# Patient Record
Sex: Female | Born: 1945
Health system: Southern US, Community
[De-identification: ages and names within clinical notes are randomized; demographics above are authoritative.]

## PROBLEM LIST (undated history)

## (undated) DIAGNOSIS — C50919 Malignant neoplasm of unspecified site of unspecified female breast: Secondary | ICD-10-CM

## (undated) DIAGNOSIS — N814 Uterovaginal prolapse, unspecified: Secondary | ICD-10-CM

## (undated) DIAGNOSIS — Z8781 Personal history of (healed) traumatic fracture: Secondary | ICD-10-CM

## (undated) DIAGNOSIS — B009 Herpesviral infection, unspecified: Secondary | ICD-10-CM

## (undated) DIAGNOSIS — T7840XA Allergy, unspecified, initial encounter: Secondary | ICD-10-CM

## (undated) DIAGNOSIS — Z780 Asymptomatic menopausal state: Secondary | ICD-10-CM

## (undated) DIAGNOSIS — M8580 Other specified disorders of bone density and structure, unspecified site: Secondary | ICD-10-CM

## (undated) DIAGNOSIS — I10 Essential (primary) hypertension: Secondary | ICD-10-CM

## (undated) HISTORY — DX: Personal history of (healed) traumatic fracture: Z87.81

## (undated) HISTORY — DX: Uterovaginal prolapse, unspecified: N81.4

## (undated) HISTORY — DX: Essential (primary) hypertension: I10

## (undated) HISTORY — PX: TUBAL LIGATION: SHX77

## (undated) HISTORY — DX: Herpesviral infection, unspecified: B00.9

## (undated) HISTORY — DX: Asymptomatic menopausal state: Z78.0

## (undated) HISTORY — DX: Malignant neoplasm of unspecified site of unspecified female breast: C50.919

## (undated) HISTORY — DX: Allergy, unspecified, initial encounter: T78.40XA

---

## 1996-05-22 DIAGNOSIS — Z780 Asymptomatic menopausal state: Secondary | ICD-10-CM

## 1996-05-22 HISTORY — DX: Asymptomatic menopausal state: Z78.0

## 1998-08-23 ENCOUNTER — Other Ambulatory Visit: Admission: RE | Admit: 1998-08-23 | Discharge: 1998-08-23 | Payer: Self-pay | Admitting: Gynecology

## 1999-09-07 ENCOUNTER — Other Ambulatory Visit: Admission: RE | Admit: 1999-09-07 | Discharge: 1999-09-07 | Payer: Self-pay | Admitting: Obstetrics and Gynecology

## 2000-01-06 ENCOUNTER — Encounter: Payer: Self-pay | Admitting: Family Medicine

## 2000-01-06 ENCOUNTER — Encounter: Admission: RE | Admit: 2000-01-06 | Discharge: 2000-01-06 | Payer: Self-pay | Admitting: Family Medicine

## 2000-09-12 ENCOUNTER — Other Ambulatory Visit: Admission: RE | Admit: 2000-09-12 | Discharge: 2000-09-12 | Payer: Self-pay | Admitting: Obstetrics and Gynecology

## 2001-01-16 ENCOUNTER — Encounter: Admission: RE | Admit: 2001-01-16 | Discharge: 2001-01-16 | Payer: Self-pay | Admitting: Family Medicine

## 2001-01-16 ENCOUNTER — Encounter: Payer: Self-pay | Admitting: Family Medicine

## 2001-02-04 ENCOUNTER — Encounter: Admission: RE | Admit: 2001-02-04 | Discharge: 2001-02-04 | Payer: Self-pay | Admitting: Family Medicine

## 2001-02-04 ENCOUNTER — Encounter: Payer: Self-pay | Admitting: Family Medicine

## 2001-11-19 ENCOUNTER — Other Ambulatory Visit: Admission: RE | Admit: 2001-11-19 | Discharge: 2001-11-19 | Payer: Self-pay | Admitting: *Deleted

## 2002-01-22 ENCOUNTER — Encounter: Payer: Self-pay | Admitting: Family Medicine

## 2002-01-22 ENCOUNTER — Encounter: Admission: RE | Admit: 2002-01-22 | Discharge: 2002-01-22 | Payer: Self-pay | Admitting: Family Medicine

## 2002-12-02 ENCOUNTER — Other Ambulatory Visit: Admission: RE | Admit: 2002-12-02 | Discharge: 2002-12-02 | Payer: Self-pay | Admitting: Obstetrics and Gynecology

## 2003-01-28 ENCOUNTER — Encounter: Payer: Self-pay | Admitting: Family Medicine

## 2003-01-28 ENCOUNTER — Encounter: Admission: RE | Admit: 2003-01-28 | Discharge: 2003-01-28 | Payer: Self-pay | Admitting: Family Medicine

## 2004-01-27 ENCOUNTER — Other Ambulatory Visit: Admission: RE | Admit: 2004-01-27 | Discharge: 2004-01-27 | Payer: Self-pay | Admitting: Obstetrics and Gynecology

## 2004-02-03 ENCOUNTER — Encounter: Admission: RE | Admit: 2004-02-03 | Discharge: 2004-02-03 | Payer: Self-pay | Admitting: Family Medicine

## 2004-02-10 ENCOUNTER — Encounter: Admission: RE | Admit: 2004-02-10 | Discharge: 2004-02-10 | Payer: Self-pay | Admitting: Family Medicine

## 2005-02-23 ENCOUNTER — Encounter: Admission: RE | Admit: 2005-02-23 | Discharge: 2005-02-23 | Payer: Self-pay | Admitting: Obstetrics and Gynecology

## 2005-06-21 ENCOUNTER — Other Ambulatory Visit: Admission: RE | Admit: 2005-06-21 | Discharge: 2005-06-21 | Payer: Self-pay | Admitting: Obstetrics and Gynecology

## 2006-02-26 ENCOUNTER — Encounter: Admission: RE | Admit: 2006-02-26 | Discharge: 2006-02-26 | Payer: Self-pay | Admitting: Obstetrics and Gynecology

## 2006-09-05 ENCOUNTER — Other Ambulatory Visit: Admission: RE | Admit: 2006-09-05 | Discharge: 2006-09-05 | Payer: Self-pay | Admitting: Obstetrics and Gynecology

## 2006-11-29 ENCOUNTER — Ambulatory Visit: Payer: Self-pay | Admitting: Cardiology

## 2006-12-18 ENCOUNTER — Ambulatory Visit: Payer: Self-pay

## 2006-12-18 ENCOUNTER — Ambulatory Visit: Payer: Self-pay | Admitting: Cardiology

## 2006-12-18 LAB — CONVERTED CEMR LAB
ALT: 19 units/L (ref 0–35)
Bilirubin, Direct: 0.1 mg/dL (ref 0.0–0.3)
Cholesterol: 215 mg/dL (ref 0–200)
Direct LDL: 146.5 mg/dL
HDL: 41.9 mg/dL (ref >39.0)
Total CHOL/HDL Ratio: 5.1
Total Protein: 6.8 g/dL (ref 6.0–8.3)
Triglycerides: 61 mg/dL (ref 0–149)

## 2007-01-10 ENCOUNTER — Ambulatory Visit: Payer: Self-pay | Admitting: Cardiology

## 2007-02-28 ENCOUNTER — Encounter: Admission: RE | Admit: 2007-02-28 | Discharge: 2007-02-28 | Payer: Self-pay | Admitting: Obstetrics and Gynecology

## 2007-03-06 ENCOUNTER — Encounter: Admission: RE | Admit: 2007-03-06 | Discharge: 2007-03-06 | Payer: Self-pay | Admitting: Obstetrics and Gynecology

## 2007-11-01 ENCOUNTER — Ambulatory Visit: Payer: Self-pay | Admitting: Internal Medicine

## 2007-11-20 ENCOUNTER — Ambulatory Visit: Payer: Self-pay | Admitting: Internal Medicine

## 2007-11-27 ENCOUNTER — Other Ambulatory Visit: Admission: RE | Admit: 2007-11-27 | Discharge: 2007-11-27 | Payer: Self-pay | Admitting: Obstetrics and Gynecology

## 2008-03-26 ENCOUNTER — Encounter: Admission: RE | Admit: 2008-03-26 | Discharge: 2008-03-26 | Payer: Self-pay | Admitting: Obstetrics and Gynecology

## 2008-04-13 ENCOUNTER — Encounter (INDEPENDENT_AMBULATORY_CARE_PROVIDER_SITE_OTHER): Payer: Self-pay | Admitting: Diagnostic Radiology

## 2008-04-13 ENCOUNTER — Encounter: Admission: RE | Admit: 2008-04-13 | Discharge: 2008-04-13 | Payer: Self-pay | Admitting: Obstetrics and Gynecology

## 2008-04-20 ENCOUNTER — Encounter: Admission: RE | Admit: 2008-04-20 | Discharge: 2008-04-20 | Payer: Self-pay | Admitting: Obstetrics and Gynecology

## 2008-04-28 ENCOUNTER — Ambulatory Visit: Admission: RE | Admit: 2008-04-28 | Discharge: 2008-05-19 | Payer: Self-pay | Admitting: Radiation Oncology

## 2008-05-22 HISTORY — PX: MASTECTOMY: SHX3

## 2008-06-11 ENCOUNTER — Ambulatory Visit (HOSPITAL_COMMUNITY): Admission: RE | Admit: 2008-06-11 | Discharge: 2008-06-12 | Payer: Self-pay | Admitting: Surgery

## 2008-06-11 ENCOUNTER — Encounter (INDEPENDENT_AMBULATORY_CARE_PROVIDER_SITE_OTHER): Payer: Self-pay | Admitting: Surgery

## 2008-06-30 ENCOUNTER — Ambulatory Visit: Payer: Self-pay | Admitting: Oncology

## 2008-07-08 LAB — CBC WITH DIFFERENTIAL/PLATELET
Basophils Absolute: 0 10*3/uL (ref 0.0–0.1)
Eosinophils Absolute: 0.3 10*3/uL (ref 0.0–0.5)
HCT: 42.6 % (ref 34.8–46.6)
HGB: 14.5 g/dL (ref 11.6–15.9)
MONO#: 0.7 10*3/uL (ref 0.1–0.9)
NEUT%: 70.2 % (ref 39.6–76.8)
WBC: 7.3 10*3/uL (ref 3.9–10.0)
lymph#: 1.2 10*3/uL (ref 0.9–3.3)

## 2008-07-08 LAB — COMPREHENSIVE METABOLIC PANEL
ALT: 16 U/L (ref 0–35)
BUN: 14 mg/dL (ref 6–23)
CO2: 29 mEq/L (ref 19–32)
Calcium: 9.9 mg/dL (ref 8.4–10.5)
Chloride: 101 mEq/L (ref 96–112)
Creatinine, Ser: 0.73 mg/dL (ref 0.40–1.20)
Total Bilirubin: 0.4 mg/dL (ref 0.3–1.2)

## 2008-09-19 HISTORY — PX: AUGMENTATION MAMMAPLASTY: SUR837

## 2009-03-29 ENCOUNTER — Encounter: Admission: RE | Admit: 2009-03-29 | Discharge: 2009-03-29 | Payer: Self-pay | Admitting: Obstetrics and Gynecology

## 2010-03-30 ENCOUNTER — Encounter: Admission: RE | Admit: 2010-03-30 | Discharge: 2010-03-30 | Payer: Self-pay | Admitting: Obstetrics and Gynecology

## 2010-04-21 ENCOUNTER — Emergency Department (HOSPITAL_COMMUNITY)
Admission: EM | Admit: 2010-04-21 | Discharge: 2010-04-21 | Payer: Self-pay | Source: Home / Self Care | Admitting: Emergency Medicine

## 2010-06-09 ENCOUNTER — Encounter
Admission: RE | Admit: 2010-06-09 | Discharge: 2010-06-21 | Payer: Self-pay | Source: Home / Self Care | Attending: Family Medicine | Admitting: Family Medicine

## 2010-06-11 ENCOUNTER — Encounter: Payer: Self-pay | Admitting: Obstetrics and Gynecology

## 2010-06-12 ENCOUNTER — Encounter: Payer: Self-pay | Admitting: Obstetrics and Gynecology

## 2010-06-15 ENCOUNTER — Encounter: Admit: 2010-06-15 | Payer: Self-pay | Admitting: Family Medicine

## 2010-06-22 ENCOUNTER — Ambulatory Visit: Payer: No Typology Code available for payment source | Attending: Family Medicine | Admitting: Physical Therapy

## 2010-06-22 DIAGNOSIS — M542 Cervicalgia: Secondary | ICD-10-CM | POA: Insufficient documentation

## 2010-06-22 DIAGNOSIS — M2569 Stiffness of other specified joint, not elsewhere classified: Secondary | ICD-10-CM | POA: Insufficient documentation

## 2010-06-22 DIAGNOSIS — IMO0001 Reserved for inherently not codable concepts without codable children: Secondary | ICD-10-CM | POA: Insufficient documentation

## 2010-06-24 ENCOUNTER — Ambulatory Visit: Payer: No Typology Code available for payment source | Admitting: Physical Therapy

## 2010-06-24 ENCOUNTER — Encounter: Payer: Self-pay | Admitting: Physical Therapy

## 2010-06-27 ENCOUNTER — Ambulatory Visit: Payer: No Typology Code available for payment source | Admitting: Physical Therapy

## 2010-06-29 ENCOUNTER — Ambulatory Visit: Payer: No Typology Code available for payment source | Admitting: Physical Therapy

## 2010-07-04 ENCOUNTER — Ambulatory Visit: Payer: No Typology Code available for payment source | Admitting: Physical Therapy

## 2010-07-06 ENCOUNTER — Ambulatory Visit: Payer: No Typology Code available for payment source | Admitting: Physical Therapy

## 2010-07-11 ENCOUNTER — Ambulatory Visit: Payer: No Typology Code available for payment source

## 2010-07-13 ENCOUNTER — Ambulatory Visit: Payer: No Typology Code available for payment source | Admitting: Physical Therapy

## 2010-07-18 ENCOUNTER — Encounter: Payer: No Typology Code available for payment source | Admitting: Physical Therapy

## 2010-07-20 ENCOUNTER — Ambulatory Visit: Payer: No Typology Code available for payment source | Admitting: Physical Therapy

## 2010-09-05 LAB — DIFFERENTIAL
Basophils Absolute: 0 10*3/uL (ref 0.0–0.1)
Lymphocytes Relative: 24 % (ref 12–46)
Monocytes Absolute: 0.4 10*3/uL (ref 0.1–1.0)
Neutro Abs: 4.7 10*3/uL (ref 1.7–7.7)

## 2010-09-05 LAB — CBC
Hemoglobin: 14.8 g/dL (ref 12.0–15.0)
Platelets: 257 10*3/uL (ref 150–400)
RDW: 13.2 % (ref 11.5–15.5)
WBC: 6.6 10*3/uL (ref 4.0–10.5)

## 2010-09-05 LAB — BASIC METABOLIC PANEL
Calcium: 10 mg/dL (ref 8.4–10.5)
GFR calc non Af Amer: >60 mL/min (ref >60)
Glucose, Bld: 91 mg/dL (ref 70–99)
Sodium: 140 mEq/L (ref 135–145)

## 2010-10-04 NOTE — Op Note (Signed)
NAMESHERETA, CROTHERS               ACCOUNT NO.:  [id number]   MEDICAL RECORD NO.:  [id number]          PATIENT TYPE:  OIB   LOCATION:  5155                         FACILITY:  [facility]   PHYSICIAN:  Etter Sjogren, M.D.     DATE OF BIRTH:  04/24/1946   DATE OF PROCEDURE:  06/11/2008  DATE OF DISCHARGE:                               OPERATIVE REPORT   PREOPERATIVE DIAGNOSIS:  Right breast cancer.   POSTOPERATIVE DIAGNOSIS:  Right breast cancer.   PROCEDURES PERFORMED:  1. Right breast reconstruction of the tissue expander.  2. Placement of acellular dermal matrix for inframammary      reconstruction.   SURGEON:  Etter Sjogren, MD   ANESTHESIA:  General.   ESTIMATED BLOOD LOSS:  Minimal.   CLINICAL NOTE:  This 65 year old woman has breast cancer, desires  reconstruction.  The options were discussed with her and she preferred  to use a tissue expander, followed as a planned staged procedure by  placing the implant.  This procedure was discussed with her in detail  including the probable use of acellular dermal matrix and she was in  agreement with this plan.  She will be photographed and she understood  the risks included, but were not limited to bleeding, infection,  anesthesia complications, healing problems, scarring, fluid collections,  loss of tissue including failure of skin flaps, failure of the acellular  dermal matrix to take loss of symmetry including eventual asymmetry,  displacement of the tissue expander, failure of the device, and eventual  capsular contracture as well as overall disappointment.  She understood  all this and wished to proceed.   DESCRIPTION OF PROCEDURE:  The patient was in the operating room and the  mastectomy completed without complications.  Sentinel lymph nodes were  reported as negative from the report from the pathologist.  The  dissection was carried out at the inferior aspect of pectoralis muscle  elevating it and developing a submuscular  pocket and then a little bit  of serratus anterior was elevated out laterally.  The area was irrigated  thoroughly with saline and meticulous hemostasis was achieved with  electrocautery.  The acellular dermal matrix was then positioned.  It  was cut to fit the defect in the inframammary pole of the breast  reconstruction and was secured with the 4-0 PDS suture from the inferior  border of the pectoralis major muscle to the acellular dermal matrix.  After thoroughly cleaning the gloves, the tissue expander was prepared,  this was an animated tissue expander from [employer], the lot  number was [id number], it was 400 mL, style tissue expander.  The  expander was soaked in antibiotic solution and the submuscular pocket  was bathed in antibiotic solution and all of this was allowed to dwell  there for 5 minutes or longer.  The tissue expander having been  positioned in place at the inferior aspect of the inframammary crease,  the lower limb of the PDS was then secured with 4-0 PDS running suture.  In addition, a few interrupted 4-0 PDS sutures were placed immediately.  The lateral  aspect of this portion of reconstruction was left open after  allowing fluid to drain to the outside to the drains.  Blake drains were  positioned, 19 [place] brought through separate stab wounds  inferolaterally and secured with 3-0 Prolene sutures.  Again, irrigation  and antibiotic solution was placed and the closure with 3-0 Monocryl  interrupted deep dermal sutures, and running 4-0 Monocryl  subcuticular suture.  The mastectomy flaps had excellent color and  appeared to be viable.  The Steri-Strips were applied to the wound, and  a dry sterile dressing, circumferential Ace wraps were applied, and she  was transferred to the recovery room stable having tolerated the  procedure well.      Etter Sjogren, M.D.  Electronically Signed     DB/MEDQ  D:  06/11/2008  T:  06/12/2008  Job:  [id number]

## 2010-10-04 NOTE — Op Note (Signed)
Kendra Velazquez, Kendra Velazquez               ACCOUNT NO.:  [id number]   MEDICAL RECORD NO.:  [id number]          PATIENT TYPE:  OIB   LOCATION:  5155                         FACILITY:  [facility]   PHYSICIAN:  Clovis Pu. 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   POSTOPERATIVE DIAGNOSIS:  Right breast multifocal ductal carcinoma in  situ.   PROCEDURES:  1. Right simple mastectomy.  2. Right sentinel lymph node mapping with injection of methylene blue      dye.   SURGEON:  Maisie Fus A. Cornett, MD   ANESTHESIA:  General endotracheal anesthesia.   ESTIMATED BLOOD LOSS:  Approximately 20 mL.   SPECIMEN:  1. Two right axillary sentinel lymph nodes negative by touch prep.  2. Right breast to pathology.   INDICATIONS FOR PROCEDURE:  The patient is a 65 year old female who was  found to have multifocal right breast DCIS.  We talked about options and  she wished to undergo a right mastectomy with immediate reconstruction  by Dr. Etter Sjogren who is here today.  Risk of bleeding, infection, the  need for more surgery, arm stiffness, numbness, injury to neurovascular  structures were discussed.   DESCRIPTION OF PROCEDURE:  The patient was brought to the operating room  after she was injected in the holding area with 0.25 technetium sulfur  colloid into the right periareolar region of her right breast.  She was  then brought to the operating room.  After induction of general  anesthesia, the right breast was marked preoperatively.  It was then  prepped and draped in a sterile fashion.  Superior and inferior skin  flaps were made using curvilinear incisions above and below the nipple-  areolar complex.  Dissection was carried down just to the skin.  Superior flap was taken up to the clavicle.  Inferior flap was taken  down to  the inferior mammary fold.  The midline was also marked and it  was taken just to the midline once we got to the axilla.  Prior to this,  4 mL of methylene blue dye were injected in a subareolar position and  besides prior to incision.  We used the NeoProbe.  We identified 2  sentinel lymph nodes, one was blue and hot, second was just hot.  These  were negative by touch prep.  There were no other hot nodes in the  axilla.  The breast was then excised off the chest wall in a medial-to-  lateral fashion using the pectoralis major flap until I encountered the  lateral border of the pectoralis minor muscle.  At this point, the  breast with the tail of Mliss Sax was excised.  Specimen was sent to  pathology for evaluation.  Sentinel nodes were negative  by touch prep.  Irrigation was used to suction out.  Hemostasis was  found to be excellent.  At this portion of case, Dr. Etter Sjogren took  over for the reconstruction portion  of this case and closure of the  wound.  Please see his operative note for details of this.  At this  point, the patient remained stable.      [name], M.D.  Electronically Signed     TAC/MEDQ  D:  06/11/2008  T:  06/12/2008  Job:  [id number]

## 2010-10-04 NOTE — Assessment & Plan Note (Signed)
[facility]                            CARDIOLOGY OFFICE NOTE   NAME:Kendra Velazquez, Kendra Velazquez                      MRN:          [id number]  DATE:01/10/2007                            DOB:          01-26-1946    Kendra Velazquez is doing well, and Kendra Velazquez is back for followup.  Kendra Velazquez has  brought a diary of Kendra Velazquez blood pressures.  Kendra Velazquez systolic pressure ranges in  Kendra range of 120-135, and Kendra Velazquez diastolic in Kendra range of 70-80.  Kendra Velazquez does  not have marked swings in Kendra Velazquez blood pressure documented at this time.  It appears that Kendra Velazquez current blood pressure medications are adequate.  Because of a question of a carotid bruit, Kendra Velazquez had carotid Dopplers done.  There is mild intimal thickening with no hemodynamically significant  abnormality; therefore, Kendra impression is that Kendra Velazquez would have 0-39%  range bilateral stenoses, and I explained to Kendra Velazquez that Kendra Velazquez has no  significant disease at this time.  Kendra Velazquez did have a follow-up  cholesterol after Kendra Velazquez trip to [place].  Kendra Velazquez total was 215, increased from  186.  Triglycerides were 61.  HDL was 42, decreased from 52.  Kendra Velazquez LDL  cholesterol was 146, increased from 126.  Kendra prior labs were in  December, 2007.  We discussed these labs.  See Kendra discussion below.  Kendra Velazquez is not having any significant chest pain or shortness of breath.   PAST MEDICAL HISTORY:  Other medical problems, see Kendra list below.   ALLERGIES:  TETRACYCLINE.   MEDICATIONS:  Altace 10, vitamin D, multivitamin, Citracal plus D.   REVIEW OF SYSTEMS:  Kendra Velazquez is feeling well and has no significant problems  at this time.   PHYSICAL EXAMINATION:  Blood pressure today is 130/88 with a pulse of  68.  Velazquez is oriented to person, place, and time.  Kendra Velazquez affect is normal.  HEENT:  No xanthelasma.  Kendra Velazquez has normal extraocular motion.  NECK:  I do not hear a carotid bruit today.  There is no jugular venous  distention.  LUNGS:  Clear.  Respiratory effort is not labored.  CARDIAC:  An  S1 with an S2.  There are no significant murmurs today.  ABDOMEN:  Soft.  Kendra Velazquez has normal bowel sounds.  EXTREMITIES:  There is no peripheral edema.   Labs done are outlined above, including Kendra carotid Doppler and Kendra  cholesterol levels.   Kendra Velazquez's Framingham Risk Score leads to an 8% 10-year risk.  Kendra Velazquez  has risk factors, including hypertension, that is treated, family  history, and age.  Kendra Velazquez LDL on this measurement is a risk factor, so Kendra Velazquez  has 3-4 risk factors.   PROBLEMS:  1. Elevated LDL.  I discussed with Kendra Velazquez Kendra fact that Kendra formal      recommendation would be for Kendra Velazquez LDL to be below 130.  I have      encouraged Kendra Velazquez to work harder on Kendra Velazquez diet to get it into this      range.  Kendra Velazquez does not meet strict criteria for having had an LDL  below 100, although this would be optimal.  I believe Kendra Velazquez would      need drug treatment for this, and Kendra Velazquez is not in favor of this at      this time.  These issues will be followed.  We gave Kendra Velazquez a      recommendation for a dietician, and Kendra Velazquez will have Kendra Velazquez cholesterol      checked in 2-3 months.  I will see Kendra Velazquez back for followup in one      year.  2. Question of a systolic murmur.  I do not hear a murmur today.  Kendra Velazquez      does not need a 2D echo.  3. Strong family history of coronary disease.  4. Ecchymosis from low dose aspirin over time.  I have considered      whether there is a formal recommendation for Kendra Velazquez to take aspirin at      this time, and this would not be recommended until Kendra Velazquez is over 65.      Because Kendra Velazquez has ecchymosis, I have not pushed aspirin.  5. Question of a very soft left carotid bruit.  Kendra Velazquez Doppler shows no      significant disease.  6. Variation in Kendra Velazquez blood pressure historically.  Kendra Velazquez blood pressure      is nicely controlled by Kendra Velazquez measurements.  Kendra Velazquez should stay on Kendra Velazquez      current medications.   Kendra Velazquez is stable and will go about full activities, and I will see  Kendra Velazquez back in one year for followup.      Luis Abed, MD, [facility]  Electronically Signed    JDK/MedQ  DD: 01/10/2007  DT: 01/11/2007  Job #: [id number]   cc:   Quita Skye. Artis Flock, M.D.

## 2010-10-04 NOTE — Assessment & Plan Note (Signed)
[facility]                            CARDIOLOGY OFFICE NOTE   NAME:Kendra Velazquez, Kendra Velazquez                      MRN:          [id number]  DATE:11/29/2006                            DOB:          02-Jan-1946    CARDIOLOGY CONSULTATION.   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  She has been quite stable  medically.  She does have a strong family history of coronary disease in  that her father had a fatal MI at age 25.  He was not a smoker.  She has  not had any chest pain.  She has had no significant shortness of breath.  Over time she has had some variation in her blood pressure.  In general,  it is nicely controlled.  On some occasions at her gynecologist's, the  pressure has been as high as 160/95.  Gynecology has been quite  concerned and when she has seen Dr. Artis Flock in followup her blood pressure  has been adequately controlled.  She is on Altace 10 mg daily.  She does  have a blood pressure cuff and she brings me pressures, showing that in  general her pressure is in the range of 120-130 systolic and 70-80  diastolic.  She sometimes has systolics as high as the 150's.  The  pressures obtained in the gynecology office seem higher than her usual.  She had her blood pressure cuff calibrated through Dr. Blair Heys office  and it calibrated quite well.  Her pressure today in the office is  139/88.   There was also question of a murmur.  This has not been marked finding  through Dr. Blair Heys office.  Today on my exam I do not hear a marked  murmur.  She may have an intermittent flow murmur.  Patient tells me  that she started to take low-dose aspirin and developed ecchymosis and  she has stopped this.  I do have labs from December of 2007 showing that  her HDL was 52 and her triglycerides 38, but her LDL was 126.   PAST MEDICAL HISTORY:  ALLERGIES:  TETRACYCLINE.   Medications:  1.  Altace 10 mg daily.  2. Vitamin D weekly.  3. Multivitamin.  4. Citracal __________.   Other Medical Problems:  See the complete list below.   SOCIAL HISTORY:  1. The patient does not smoke.  2. 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  3. The patient does exercise daily and is quite active and has no      significant symptoms.   FAMILY HISTORY:  The patient's father had a sudden heart attack at age  40 that was fatal.   REVIEW OF SYSTEMS:  She has some MILD SEASONAL ALLERGIES.  Otherwise,  her review of systems is negative.   PHYSICAL EXAM:  Patient's weight is 117 pounds.  Blood pressure is  135/88 with a pulse of 66.  The patient is oriented to person, time and  place.  Affect is normal.  HEENT:  No xanthelasma, she has  normal extraocular motion.  There is  question of a very soft left carotid bruit.  LUNGS:  Clear.  Respiratory effort is not labored.  CARDIAC EXAM:  An S1 with an S2.  There are no clicks or significant  murmurs.  ABDOMEN:  Soft.  She has no masses or bruits.  There are normal bowel  sounds.  She has 2+ distal pulses and no peripheral edema and there are  no musculoskeletal deformities.   EKG is normal.   PROBLEMS:  1. An LDL of 126.  She needs followup fasting lipid profile after she      returns from her trip to [place].  2. Question of a systolic murmur.  I do not hear a murmur today.  At      some point we may proceed with a 2D echocardiogram but not at this      point.  3. Strong family history of coronary artery disease.  4. Ecchymosis from low dose aspirin.  At this point she will not take      aspirin, but we may give her 1 every 3 days in the future.  5. Question of a very soft left carotid bruit.  This is a borderline      call, but with her family history we will proceed with carotid      Dopplers.  6. Variation in her blood pressure as described above.  It seems very      unlikely that she has anything other than  idiopathic hypertension      or essential hypertension.  She is on Altace.  Her blood pressures      are adequately controlled other than in unique situations.  We will      not change her medicines at this time.  I will see her back and we      will consider in the future whether other medications can be added.      It may be that low dose of a beta-blocker may help her.   I will see her for followup.  Records sent from Dr. Blair Heys office are  very helpful.  Dr. Artis Flock has done an excellent job of assessing her over  time.     Luis Abed, MD, [facility]  Electronically Signed    JDK/MedQ  DD: 11/29/2006  DT: 11/30/2006  Job #: [id number]   cc:   Quita Skye. Artis Flock, M.D.

## 2011-03-10 ENCOUNTER — Other Ambulatory Visit: Payer: Self-pay | Admitting: Obstetrics and Gynecology

## 2011-03-10 DIAGNOSIS — Z9011 Acquired absence of right breast and nipple: Secondary | ICD-10-CM

## 2011-03-10 DIAGNOSIS — Z1231 Encounter for screening mammogram for malignant neoplasm of breast: Secondary | ICD-10-CM

## 2011-04-04 ENCOUNTER — Ambulatory Visit
Admission: RE | Admit: 2011-04-04 | Discharge: 2011-04-04 | Disposition: A | Discharge status: Home / Self Care | Payer: No Typology Code available for payment source | Source: Ambulatory Visit | Attending: Obstetrics and Gynecology | Admitting: Obstetrics and Gynecology

## 2011-04-04 DIAGNOSIS — Z1231 Encounter for screening mammogram for malignant neoplasm of breast: Secondary | ICD-10-CM

## 2011-04-04 DIAGNOSIS — Z9011 Acquired absence of right breast and nipple: Secondary | ICD-10-CM

## 2012-03-22 ENCOUNTER — Other Ambulatory Visit: Payer: Self-pay | Admitting: Obstetrics & Gynecology

## 2012-03-22 DIAGNOSIS — M899 Disorder of bone, unspecified: Secondary | ICD-10-CM

## 2012-03-25 ENCOUNTER — Other Ambulatory Visit: Payer: Self-pay | Admitting: Obstetrics & Gynecology

## 2012-03-25 DIAGNOSIS — Z1231 Encounter for screening mammogram for malignant neoplasm of breast: Secondary | ICD-10-CM

## 2012-03-25 DIAGNOSIS — Z9011 Acquired absence of right breast and nipple: Secondary | ICD-10-CM

## 2012-04-30 ENCOUNTER — Ambulatory Visit
Admission: RE | Admit: 2012-04-30 | Discharge: 2012-04-30 | Disposition: A | Discharge status: Home / Self Care | Payer: BC Managed Care – PPO | Source: Ambulatory Visit | Attending: Obstetrics & Gynecology | Admitting: Obstetrics & Gynecology

## 2012-04-30 DIAGNOSIS — Z9011 Acquired absence of right breast and nipple: Secondary | ICD-10-CM

## 2012-04-30 DIAGNOSIS — Z1231 Encounter for screening mammogram for malignant neoplasm of breast: Secondary | ICD-10-CM

## 2012-04-30 DIAGNOSIS — M899 Disorder of bone, unspecified: Secondary | ICD-10-CM

## 2012-08-19 ENCOUNTER — Telehealth: Payer: Self-pay | Admitting: Obstetrics and Gynecology

## 2012-08-19 NOTE — Telephone Encounter (Signed)
Pt needs an order to get new bras from [facility]. Fax number:  [phone]

## 2012-08-20 ENCOUNTER — Telehealth: Payer: Self-pay | Admitting: Orthopedic Surgery

## 2012-08-20 NOTE — Telephone Encounter (Signed)
Spoke with pt who says Medicare will no longer accept the store order form like they used to. Reports Dr Tresa Res can write order for "bras and prosthetic supplies" on a prescription pad and fax to [facility]. Fax # [phone]. Insurance will pay for supplies twice a year at 80%.  aa

## 2012-08-20 NOTE — Telephone Encounter (Signed)
I'll bring you the signed rx for you to fax.   Thanks.

## 2012-08-20 NOTE — Telephone Encounter (Signed)
Spoke with pt who had a mastectomy 4 years ago. Her surgeon used to write her an order to get special bras for post-surgical pts. She is no longer seeing surgeon and is wondering if Dr Tresa Res would be able to write the order for the bras now.  aa

## 2012-08-20 NOTE — Telephone Encounter (Signed)
Spoke to pt to relay msg that Dr Tresa Res would be glad to sign order for bras. Pt to have order form faxed to [place]. Fax # given.  aa

## 2012-08-20 NOTE — Telephone Encounter (Signed)
Sure, that would be fine.  Usually the store has a pre-printed order form that is acceptable for insurance companies.  I'll be glad to sign.

## 2012-08-20 NOTE — Telephone Encounter (Signed)
LM that her order request will be forwarded to [facility] and someone would be in touch when order is faxed.  aa

## 2012-08-22 ENCOUNTER — Telehealth: Payer: Self-pay | Admitting: Obstetrics and Gynecology

## 2012-08-22 NOTE — Telephone Encounter (Signed)
Misty Stanley from [facility] re:rx date written 08/20/12 but pt seen  08/19/12-- need new rx with correct date faxed to:  [phone]

## 2012-08-23 NOTE — Telephone Encounter (Signed)
I'll bring you the new RX.

## 2012-09-10 ENCOUNTER — Telehealth: Payer: Self-pay | Admitting: Genetic Counselor

## 2012-09-10 NOTE — Telephone Encounter (Signed)
S/W PT IN RE TO GENETIC APPT 05/16 @ 11W/CATHERINE FINE WELCOME PACKET MAILED.

## 2012-10-04 ENCOUNTER — Ambulatory Visit (HOSPITAL_BASED_OUTPATIENT_CLINIC_OR_DEPARTMENT_OTHER): Payer: Medicare Other | Admitting: Genetic Counselor

## 2012-10-04 ENCOUNTER — Other Ambulatory Visit: Payer: Medicare Other | Admitting: Lab

## 2012-10-04 DIAGNOSIS — C50911 Malignant neoplasm of unspecified site of right female breast: Secondary | ICD-10-CM

## 2012-10-04 DIAGNOSIS — C50919 Malignant neoplasm of unspecified site of unspecified female breast: Secondary | ICD-10-CM

## 2012-10-04 DIAGNOSIS — Z808 Family history of malignant neoplasm of other organs or systems: Secondary | ICD-10-CM

## 2012-10-04 NOTE — Progress Notes (Signed)
Dr. Kevan Ny requested a cancer genetics consultation for Kendra Velazquez, a 67 y.o. female, due to a personal and family history of cancer.  Kendra Velazquez presents to clinic today to discuss the possibility of a hereditary predisposition to cancer, genetic testing, and to further clarify her future cancer risks, as well as potential cancer risk for family members.   HISTORY OF PRESENT ILLNESS: Kendra Velazquez was diagnosed with right breast cancer, DCIS, at the age of 25. She reports there were two areas of the right breast affected, but she is unsure if these were separate primary sites. She is s/p right mastectomy. She has no history of other cancer.    Past Medical History  Diagnosis Date   Breast cancer     right DCIS at age 16    Past Surgical History  Procedure Laterality Date   Mastectomy Right     HORMONAL RISK FACTORS: Menarche was at age 25 First live birth at age 64  OCP use: approximately 7 years Ovaries intact: yes Hysterectomy: no Menopausal status: postmenopausal HRT use: approximately 5 years Colonoscopy: yes; normal UTD mammogram: yes  History   Social History   Marital Status: Married    Spouse Name: N/A    Number of Children: N/A   Years of Education: N/A   Occupational History   retired    Social History Main Topics   Smoking status: Never Smoker    Smokeless tobacco: Not on file   Alcohol Use: No   Drug Use: Not on file   Sexually Active: Not on file   Other Topics Concern   Not on file   Social History Narrative   No narrative on file     FAMILY HISTORY:  During the visit, a 4-generation pedigree was obtained. Significant diagnoses include the following:  Family History  Problem Relation Age of Onset   Breast cancer Maternal Grandmother 56   Breast cancer Cousin 29    maternal 1st cousin    Kendra Velazquez's ancestry is of Caucasian descent. There is no known Jewish ancestry or consanguinity.  GENETIC COUNSELING ASSESSMENT: Ms.  Velazquez is a 67 y.o. female with a personal and family history of cancer, which includes breast cancer. Kendra Velazquez family history is somewhat suggestive of a hereditary predisposition to cancer and we, therefore, discussed and recommended the following at today's visit.   DISCUSSION: We reviewed the characteristics, features and inheritance patterns of hereditary cancer syndromes. We also discussed genetic testing, including the appropriate family members to test, the process of testing, insurance coverage and turn-around-time for results. We recommended Kendra Velazquez pursue genetic testing for a breast and ovarian cancer gene panel through [facility]. We discussed with Kendra Velazquez that we would find a negative genetic test reassuring.   PLAN: Kendra Velazquez wished to pursue genetic testing and the blood sample will be sent to [facility] for analysis of the breast and ovarian cancer gene panel.  We discussed the implications of a positive, negative and/ or variant of uncertain significance genetic test result. Results should be available within approximately 6 weeks time, at which point they will be disclosed by telephone to Kendra Velazquez, as will any additional recommendations warranted by these results. Kendra Velazquez will receive a summary of her genetic counseling visit and a copy of her results once available. This information will also be available in Epic. We encouraged Kendra Velazquez to remain in contact with cancer genetics annually so that we can continuously update the family history and  inform her of any changes in cancer genetics and testing that may be of benefit for this family. Ms.  Velazquez questions were answered to her satisfaction today. Our contact information was provided should additional questions or concerns arise. Thank you for the referral and allowing [place] to share in the care of your patient.   The patient was seen for a total of 45 minutes, greater than 50% of which was  spent face-to-face counseling.  This patient was discussed with Dr. Drue Second and she agrees with the above.    _______________________________________________________________________ For Office Staff:  Number of people involved in session: 2 Was an Intern/ student involved with case: not applicable

## 2012-10-31 ENCOUNTER — Encounter: Payer: Self-pay | Admitting: Genetic Counselor

## 2012-12-26 ENCOUNTER — Encounter: Payer: Self-pay | Admitting: Obstetrics and Gynecology

## 2012-12-27 ENCOUNTER — Ambulatory Visit (INDEPENDENT_AMBULATORY_CARE_PROVIDER_SITE_OTHER): Payer: Medicare Other | Admitting: Obstetrics and Gynecology

## 2012-12-27 ENCOUNTER — Encounter: Payer: Self-pay | Admitting: Obstetrics and Gynecology

## 2012-12-27 VITALS — BP 142/80 | HR 68 | Resp 16 | Ht 65.0 in | Wt 122.0 lb

## 2012-12-27 DIAGNOSIS — Z01419 Encounter for gynecological examination (general) (routine) without abnormal findings: Secondary | ICD-10-CM

## 2012-12-27 DIAGNOSIS — Z23 Encounter for immunization: Secondary | ICD-10-CM

## 2012-12-27 NOTE — Progress Notes (Signed)
67 y.o.   Married    Caucasian   female   G2P2   here for annual exam.    No LMP recorded. Patient is postmenopausal.          Sexually active: yes  The current method of family planning is tubal ligation and post menopausal status.    Exercising: walking, hiking daily, cardio, yoga 2 days a week, weights Last mammogram: 04/30/2012 neg  Last pap smear: 11/30/09 neg History of abnormal pap: no Smoking: never Alcohol: no Last colonoscopy:11/2007 normal, repeat in 10 years Last Bone Density:  04/30/12 osteopenia Last tetanus shot: not sure Last cholesterol check: 08/2012 normal  Hgb: pcp                Urine: declined   Family History  Problem Relation Age of Onset  . Breast cancer Maternal Grandmother 80  . Breast cancer Cousin 77    maternal 1st cousin  . Osteoporosis Mother   . Osteoporosis Maternal Aunt   . Heart disease Father     died of heart attack age 67    Patient Active Problem List   Diagnosis Date Noted  . Family history of brain cancer 10/04/2012  . Breast cancer 10/04/2012    Past Medical History  Diagnosis Date  . Breast cancer     right DCIS at age 58  . Hypertension   . Menopause 1998    Past Surgical History  Procedure Laterality Date  . Mastectomy Right 05/2008    DCIS Multicentric--Mastectomy  (neg) nodes---no tamox  . Augmentation mammaplasty Right 09/2008    right breast reconstruction  . Tubal ligation      BTSP    Allergies: Tetracyclines & related  Current Outpatient Prescriptions  Medication Sig Dispense Refill  . aspirin 81 MG tablet Take 81 mg by mouth every other day.       . Calcium Carbonate-Vitamin D (CALCIUM 600 + D PO) Take by mouth daily.      . Cetirizine HCl (ZYRTEC PO) Take by mouth as needed.      . Cholecalciferol (VITAMIN D PO) Take 50,000 Units by mouth every 14 (fourteen) days.      . Estradiol (VAGIFEM) 10 MCG TABS vaginal tablet Place vaginally once a week.       . hydrochlorothiazide (MICROZIDE) 12.5 MG capsule  daily.       . ramipril (ALTACE) 10 MG tablet Take 10 mg by mouth daily.       No current facility-administered medications for this visit.    ROS: Pertinent items are noted in HPI.  Social Hx:  Married, two children. Retired Therapist, sports  Exam:    BP 142/80  Pulse 68  Resp 16  Ht 5\' 5"  (1.651 m)  Wt 122 lb (55.339 kg)  BMI 20.3 kg/m2ht and wt stable   Wt Readings from Last 3 Encounters:  12/27/12 122 lb (55.339 kg)     Ht Readings from Last 3 Encounters:  12/27/12 5\' 5"  (1.651 m)    General appearance: alert, cooperative and appears stated age Head: Normocephalic, without obvious abnormality, atraumatic Neck: no adenopathy, supple, symmetrical, trachea midline and thyroid not enlarged, symmetric, no tenderness/mass/nodules Lungs: clear to auscultation bilaterally Breasts: Inspection negative, No nipple retraction or dimpling, No nipple discharge or bleeding, No axillary or supraclavicular adenopathy, Normal to palpation without dominant masses Heart: regular rate and rhythm Abdomen: soft, non-tender; bowel sounds normal; no masses,  no organomegaly Extremities: extremities normal, atraumatic, no cyanosis or  edema Skin: Skin color, texture, turgor normal. No rashes or lesions Lymph nodes: Cervical, supraclavicular, and axillary nodes normal. No abnormal inguinal nodes palpated Neurologic: Grossly normal   Pelvic: External genitalia:  no lesions              Urethra:  normal appearing urethra with no masses, tenderness or lesions              Bartholins and Skenes: normal                 Vagina: normal appearing vagina with normal color and discharge, no lesions              Cervix: normal appearance              Pap taken: no        Bimanual Exam:  Uterus:  uterus is normal size, shape, consistency and nontender                                      Adnexa: normal adnexa in size, nontender and no masses                                      Rectovaginal:  Confirms                                      Anus:  normal sphincter tone, no lesions  A: normal menopausal exam, no HRT     Right DCIS 2010, mastectomy and reconstruction.  Neg nodes. No tamoxifen.     HTN     P:     mammogram counseled on breast self exam, mammography screening, adequate intake of calcium and vitamin D, diet and exercise return annually or prn     An After Visit Summary was printed and given to the patient.

## 2012-12-27 NOTE — Patient Instructions (Signed)

## 2013-01-05 ENCOUNTER — Other Ambulatory Visit: Payer: Self-pay | Admitting: Obstetrics and Gynecology

## 2013-01-06 NOTE — Telephone Encounter (Signed)
Please put her paper chart on my desk 

## 2013-01-06 NOTE — Telephone Encounter (Signed)
Please advise patient was seen 12/27/12 for AEX no rx for vagifem was given.

## 2013-01-08 ENCOUNTER — Other Ambulatory Visit: Payer: Self-pay | Admitting: *Deleted

## 2013-01-08 NOTE — Telephone Encounter (Signed)
Fax From: [facility] for Vagifem 10 mcg  Last Refilled: 12/26/11 #90/1 year refills Last Aex: 12/27/12 no rx was given   Please Advise.

## 2013-01-13 ENCOUNTER — Other Ambulatory Visit: Payer: Self-pay | Admitting: *Deleted

## 2013-01-13 NOTE — Telephone Encounter (Signed)
Please advise patient was seen 12/27/12 for AEX no rx was given.

## 2013-01-14 MED ORDER — ESTRADIOL 10 MCG VA TABS: ORAL_TABLET | VAGINAL | Status: DC

## 2013-01-14 NOTE — Telephone Encounter (Signed)
Yes, fine, please refill for one year.

## 2013-01-21 ENCOUNTER — Telehealth: Payer: Self-pay | Admitting: *Deleted

## 2013-01-21 NOTE — Telephone Encounter (Signed)
Order for breast prosthetics received and sent to your office with old chart. Dr Tresa Res patient, AEX done 12-27-12.

## 2013-04-10 ENCOUNTER — Other Ambulatory Visit: Payer: Self-pay

## 2013-04-10 DIAGNOSIS — Z1231 Encounter for screening mammogram for malignant neoplasm of breast: Secondary | ICD-10-CM

## 2013-05-13 ENCOUNTER — Ambulatory Visit: Payer: Medicare Other

## 2013-05-23 ENCOUNTER — Other Ambulatory Visit: Payer: Self-pay

## 2013-05-23 ENCOUNTER — Ambulatory Visit
Admission: RE | Admit: 2013-05-23 | Discharge: 2013-05-23 | Disposition: A | Discharge status: Home / Self Care | Payer: Medicare Other | Source: Ambulatory Visit

## 2013-05-23 DIAGNOSIS — Z1231 Encounter for screening mammogram for malignant neoplasm of breast: Secondary | ICD-10-CM

## 2013-06-11 NOTE — Telephone Encounter (Signed)
Order was signed and faxed 01-2013 by Dr Sabra Heck.  Routed to provider for signature, encounter closed.

## 2013-12-29 ENCOUNTER — Ambulatory Visit (INDEPENDENT_AMBULATORY_CARE_PROVIDER_SITE_OTHER): Payer: Medicare Other | Admitting: Gynecology

## 2013-12-29 ENCOUNTER — Encounter: Payer: Self-pay | Admitting: Gynecology

## 2013-12-29 VITALS — BP 120/76 | HR 68 | Resp 12 | Ht 64.75 in | Wt 122.0 lb

## 2013-12-29 DIAGNOSIS — N952 Postmenopausal atrophic vaginitis: Secondary | ICD-10-CM

## 2013-12-29 DIAGNOSIS — Z01419 Encounter for gynecological examination (general) (routine) without abnormal findings: Secondary | ICD-10-CM

## 2013-12-29 MED ORDER — ESTRADIOL 10 MCG VA TABS: ORAL_TABLET | VAGINAL | Status: DC

## 2013-12-29 NOTE — Progress Notes (Signed)
68 y.o. Married Caucasian female   G2P2 here for annual exam. Pt reports menses are absent due to Menopause. She does not report hot flashes, does not have night sweats, does have vaginal dryness.  She is not using lubricants.  She does not report post-menopasual bleeding.  Pt had recent genetic testing for breast cancer-negative.  No LMP recorded. Patient is postmenopausal.          Sexually active: Yes.    The current method of family planning is post menopausal status.    Exercising: Yes.    yoga 2x, ellipitcal,3x/wk, walking qd, weights 3x/wk Last pap: 12/01/09 NEG Abnormal PAP: No Mammogram: 05/27/13 Bi-Rads 1 BSE: yes  Colonoscopy:  11/20/2007 Normal f/u in 10 years  DEXA: 04/30/12  Alcohol: no Tobacco: no  Labs: Darcus Austin, MD  Health Maintenance  Topic Date Due  . Colonoscopy  01/05/1997  . Zostavax  01/06/2007  . Pneumococcal Polysaccharide Vaccine Age 6 And Over  01/06/2012  . Influenza Vaccine  12/20/2013  . Mammogram  04/30/2014  . Tetanus/tdap  12/28/2022    Family History  Problem Relation Age of Onset  . Breast cancer Maternal Grandmother 80  . Breast cancer Cousin 52    maternal 1st cousin  . Osteoporosis Mother   . Osteoporosis Maternal Aunt   . Heart disease Father     died of heart attack age 68    Patient Active Problem List   Diagnosis Date Noted  . Family history of brain cancer 10/04/2012  . Breast cancer 10/04/2012    Past Medical History  Diagnosis Date  . Breast cancer     right DCIS at age 39  . Hypertension   . Menopause 1998    Past Surgical History  Procedure Laterality Date  . Mastectomy Right 05/2008    DCIS Multicentric--Mastectomy  (neg) nodes---no tamox  . Augmentation mammaplasty Right 09/2008    right breast reconstruction  . Tubal ligation      BTSP    Allergies: Tetracyclines & related  Current Outpatient Prescriptions  Medication Sig Dispense Refill  . aspirin 81 MG tablet Take 81 mg by mouth every other day.        . Calcium Carbonate-Vitamin D (CALCIUM 600 + D PO) Take by mouth daily.      . Cetirizine HCl (ZYRTEC PO) Take by mouth as needed.      . Cholecalciferol (VITAMIN D PO) Take 50,000 Units by mouth every 14 (fourteen) days.      . Estradiol (VAGIFEM) 10 MCG TABS vaginal tablet Use twice weekly.  8 tablet  12  . hydrochlorothiazide (MICROZIDE) 12.5 MG capsule daily.       . ramipril (ALTACE) 10 MG tablet Take 10 mg by mouth daily.       No current facility-administered medications for this visit.    ROS: Pertinent items are noted in HPI.  Exam:    There were no vitals taken for this visit. Weight change: @WEIGHTCHANGE @ Last 3 height recordings:  Ht Readings from Last 3 Encounters:  12/27/12 5\' 5"  (1.651 m)   General appearance: alert, cooperative and appears stated age Head: Normocephalic, without obvious abnormality, atraumatic Neck: no adenopathy, no carotid bruit, no JVD, supple, symmetrical, trachea midline and thyroid not enlarged, symmetric, no tenderness/mass/nodules Lungs: clear to auscultation bilaterally Breasts: normal appearance, no masses or tenderness, right surgically absent, no lingular mass Heart: regular rate and rhythm, S1, S2 normal, no murmur, click, rub or gallop Abdomen: soft, non-tender; bowel sounds normal;  no masses,  no organomegaly Extremities: extremities normal, atraumatic, no cyanosis or edema Skin: Skin color, texture, turgor normal. No rashes or lesions Lymph nodes: Cervical, supraclavicular, and axillary nodes normal. no inguinal nodes palpated Neurologic: Grossly normal   Pelvic: External genitalia:  no lesions              Urethra: normal appearing urethra with no masses, tenderness or lesions              Bartholins and Skenes: Bartholin's, Urethra, Skene's normal                 Vagina: atrophic              Cervix: normal appearance              Pap taken: No.        Bimanual Exam:  Uterus:  uterus is normal size, shape, consistency and  nontender                                      Adnexa:    no masses                                      Rectovaginal: Confirms                                      Anus:  normal sphincter tone, no lesions       1. Routine gynecological examination  counseled on breast self exam, mammography screening, adequate intake of calcium and vitamin D, diet and exercise return annually or prn Discussed PAP guideline changes, importance of weight bearing exercises, calcium, vit D and balanced diet.  2. Atrophic vaginitis  - Estradiol (VAGIFEM) 10 MCG TABS vaginal tablet; Use twice weekly.  Dispense: 8 tablet; Refill: 12  An After Visit Summary was printed and given to the patient.

## 2014-03-23 ENCOUNTER — Encounter: Payer: Self-pay | Admitting: Gynecology

## 2014-04-22 ENCOUNTER — Telehealth: Payer: Self-pay | Admitting: Gynecology

## 2014-04-22 NOTE — Telephone Encounter (Signed)
Left message regarding upcoming appointment has been canceled and needs to be rescheduled. °

## 2014-05-12 ENCOUNTER — Other Ambulatory Visit: Payer: Self-pay

## 2014-05-12 DIAGNOSIS — Z1231 Encounter for screening mammogram for malignant neoplasm of breast: Secondary | ICD-10-CM

## 2014-05-12 DIAGNOSIS — Z9011 Acquired absence of right breast and nipple: Secondary | ICD-10-CM

## 2014-06-01 ENCOUNTER — Ambulatory Visit
Admission: RE | Admit: 2014-06-01 | Discharge: 2014-06-01 | Disposition: A | Discharge status: Home / Self Care | Payer: Medicare Other | Source: Ambulatory Visit

## 2014-06-01 DIAGNOSIS — Z9011 Acquired absence of right breast and nipple: Secondary | ICD-10-CM

## 2014-06-01 DIAGNOSIS — Z1231 Encounter for screening mammogram for malignant neoplasm of breast: Secondary | ICD-10-CM

## 2015-01-01 ENCOUNTER — Ambulatory Visit (INDEPENDENT_AMBULATORY_CARE_PROVIDER_SITE_OTHER): Payer: Medicare Other | Admitting: Obstetrics and Gynecology

## 2015-01-01 ENCOUNTER — Encounter: Payer: Self-pay | Admitting: Obstetrics and Gynecology

## 2015-01-01 VITALS — BP 120/68 | HR 70 | Resp 16 | Ht 65.0 in | Wt 124.0 lb

## 2015-01-01 DIAGNOSIS — M8580 Other specified disorders of bone density and structure, unspecified site: Secondary | ICD-10-CM | POA: Diagnosis not present

## 2015-01-01 DIAGNOSIS — Z01419 Encounter for gynecological examination (general) (routine) without abnormal findings: Secondary | ICD-10-CM

## 2015-01-01 NOTE — Patient Instructions (Signed)

## 2015-01-01 NOTE — Progress Notes (Signed)
Patient ID: Kendra Velazquez, female   DOB: 07/29/1945, 69 y.o.   MRN: [id number] 69 y.o. G2P2 Married Caucasian female here for annual exam.    Dr. Inda Merlin prescribing Vit D and Valtrex for cold sores.   Denies postmenopausal bleeding .  Retired from [employer].   PCP:  Darcus Austin, MD  Patient's last menstrual period was 05/22/2000 (approximate).          Sexually active: Yes.   female The current method of family planning is Tubal/ post menopausal status.    Exercising: Yes.    walking, yoga and weights. Smoker:  no  Health Maintenance: Pap:  12-01-09 Neg History of abnormal Pap:  no MMG:  06-02-14 Density Cat.B/neg:[facility] Colonoscopy:  11-20-07 normal with Dr. Scarlette Shorts. Next due 11/2017. BMD:   04-30-12  Result  Osteopenia:[facility] TDaP:  12-27-12 Screening Labs:  Hb today: PCP, Urine today: PCP   reports that she has never smoked. She has never used smokeless tobacco. She reports that she does not drink alcohol or use illicit drugs.  Past Medical History  Diagnosis Date  . Breast cancer     right DCIS at age 24  . Hypertension   . Menopause 1998    Past Surgical History  Procedure Laterality Date  . Mastectomy Right 05/2008    DCIS Multicentric--Mastectomy  (neg) nodes---no tamox  . Augmentation mammaplasty Right 09/2008    right breast reconstruction  . Tubal ligation      BTSP    Current Outpatient Prescriptions  Medication Sig Dispense Refill  . hydrochlorothiazide (MICROZIDE) 12.5 MG capsule daily.     . ramipril (ALTACE) 10 MG tablet Take 10 mg by mouth daily.    . Vitamin D, Ergocalciferol, (DRISDOL) 50000 UNITS CAPS capsule Patient take 1 every other week.    . valACYclovir (VALTREX) 1000 MG tablet Take 1 tablet by mouth as needed. Take 1 tablet prn for cold sores  0   No current facility-administered medications for this visit.    Family History  Problem Relation Age of Onset  . Breast cancer Maternal Grandmother 80  . Breast cancer  Cousin 69    maternal 1st cousin  . Osteoporosis Mother   . Osteoporosis Maternal Aunt   . Heart disease Father     died of heart attack age 33    ROS:  Pertinent items are noted in HPI.  Otherwise, a comprehensive ROS was negative.  Exam:   BP 120/68 mmHg  Pulse 70  Resp 16  Ht 5\' 5"  (1.651 m)  Wt 124 lb (56.246 kg)  BMI 20.63 kg/m2  LMP 05/22/2000 (Approximate)    General appearance: alert, cooperative and appears stated age Head: Normocephalic, without obvious abnormality, atraumatic Neck: no adenopathy, supple, symmetrical, trachea midline and thyroid normal to inspection and palpation Lungs: clear to auscultation bilaterally Breasts: normal appearance, no masses or tenderness, Inspection negative, No nipple retraction or dimpling, No nipple discharge or bleeding, No axillary or supraclavicular adenopathy on left.  Right breast absent and reconstruction present.  Heart: regular rate and rhythm Abdomen: soft, non-tender; bowel sounds normal; no masses,  no organomegaly Extremities: extremities normal, atraumatic, no cyanosis or edema Skin: Skin color, texture, turgor normal. No rashes or lesions Lymph nodes: Cervical, supraclavicular, and axillary nodes normal. No abnormal inguinal nodes palpated Neurologic: Grossly normal  Pelvic: External genitalia:  no lesions              Urethra:  normal appearing urethra with  no masses, tenderness or lesions              Bartholins and Skenes: normal                 Vagina: normal appearing vagina with normal color and discharge, no lesions              Cervix: no lesions              Pap taken: Yes.   Bimanual Exam:  Uterus:  normal size, contour, position, consistency, mobility, non-tender              Adnexa: normal adnexa and no mass, fullness, tenderness              Rectovaginal: Yes.  .  Confirms.              Anus:  normal sphincter tone, no lesions  Chaperone was present for exam.  Assessment:   Well woman visit with  normal exam. Hx right breast cancer.  DCIS.  Status post right mastectomy with reconstruction.  Osteopenia.   Plan: Yearly mammogram recommended after age 69.   Discussed 3D. Recommended self breast exam.  Pap and HR HPV as above. Discussed Calcium, Vitamin D, regular exercise program including cardiovascular and weight bearing exercise. Labs performed.  No..    Refills given on medications.  No..   Bone density in January 2017.  Order placed.  Patient will do this with her mammogram then.  Follow up annually and prn.      After visit summary provided.

## 2015-01-04 ENCOUNTER — Ambulatory Visit: Payer: Medicare Other | Admitting: Gynecology

## 2015-01-04 LAB — IPS PAP SMEAR ONLY

## 2015-01-12 ENCOUNTER — Encounter: Payer: Self-pay | Admitting: Internal Medicine

## 2015-05-23 DIAGNOSIS — M8580 Other specified disorders of bone density and structure, unspecified site: Secondary | ICD-10-CM

## 2015-05-23 HISTORY — DX: Other specified disorders of bone density and structure, unspecified site: M85.80

## 2015-05-23 HISTORY — PX: BREAST BIOPSY: SHX20

## 2015-05-27 ENCOUNTER — Other Ambulatory Visit: Payer: Self-pay

## 2015-05-27 DIAGNOSIS — Z1231 Encounter for screening mammogram for malignant neoplasm of breast: Secondary | ICD-10-CM

## 2015-06-22 ENCOUNTER — Ambulatory Visit
Admission: RE | Admit: 2015-06-22 | Discharge: 2015-06-22 | Disposition: A | Discharge status: Home / Self Care | Payer: Medicare Other | Source: Ambulatory Visit

## 2015-06-22 DIAGNOSIS — Z1231 Encounter for screening mammogram for malignant neoplasm of breast: Secondary | ICD-10-CM

## 2015-06-23 ENCOUNTER — Other Ambulatory Visit: Payer: Self-pay | Admitting: Obstetrics and Gynecology

## 2015-06-23 DIAGNOSIS — R928 Other abnormal and inconclusive findings on diagnostic imaging of breast: Secondary | ICD-10-CM

## 2015-06-28 ENCOUNTER — Telehealth: Payer: Self-pay

## 2015-06-28 DIAGNOSIS — R928 Other abnormal and inconclusive findings on diagnostic imaging of breast: Secondary | ICD-10-CM

## 2015-06-28 NOTE — Telephone Encounter (Signed)
Spoke with Kendra Velazquez at the [facility]. She is requesting order be placed for patient's appointment on 06/29/2015. The patient is scheduled for a left breast diagnostic mammogram with ultrasound as a follow up to her mammogram she had on 06/22/2015. Order placed for left breast diagnotic mammogram with ultrasound. Will need cosign from [place].  Routing to provider for final review. Patient agreeable to disposition. Will close encounter.

## 2015-06-29 ENCOUNTER — Other Ambulatory Visit: Payer: Self-pay | Admitting: Obstetrics and Gynecology

## 2015-06-29 ENCOUNTER — Ambulatory Visit
Admission: RE | Admit: 2015-06-29 | Discharge: 2015-06-29 | Disposition: A | Discharge status: Home / Self Care | Payer: Medicare Other | Source: Ambulatory Visit | Attending: Obstetrics and Gynecology | Admitting: Obstetrics and Gynecology

## 2015-06-29 DIAGNOSIS — R928 Other abnormal and inconclusive findings on diagnostic imaging of breast: Secondary | ICD-10-CM

## 2015-06-29 DIAGNOSIS — N6320 Unspecified lump in the left breast, unspecified quadrant: Secondary | ICD-10-CM

## 2015-06-30 ENCOUNTER — Other Ambulatory Visit: Payer: Self-pay | Admitting: Obstetrics and Gynecology

## 2015-06-30 DIAGNOSIS — N6320 Unspecified lump in the left breast, unspecified quadrant: Secondary | ICD-10-CM

## 2015-07-01 ENCOUNTER — Other Ambulatory Visit: Payer: Self-pay | Admitting: Obstetrics and Gynecology

## 2015-07-01 ENCOUNTER — Ambulatory Visit
Admission: RE | Admit: 2015-07-01 | Discharge: 2015-07-01 | Disposition: A | Discharge status: Home / Self Care | Payer: Medicare Other | Source: Ambulatory Visit | Attending: Obstetrics and Gynecology | Admitting: Obstetrics and Gynecology

## 2015-07-01 DIAGNOSIS — N6320 Unspecified lump in the left breast, unspecified quadrant: Secondary | ICD-10-CM

## 2015-08-12 ENCOUNTER — Ambulatory Visit
Admission: RE | Admit: 2015-08-12 | Discharge: 2015-08-12 | Disposition: A | Discharge status: Home / Self Care | Payer: Medicare Other | Source: Ambulatory Visit | Attending: Obstetrics and Gynecology | Admitting: Obstetrics and Gynecology

## 2015-08-12 DIAGNOSIS — M8580 Other specified disorders of bone density and structure, unspecified site: Secondary | ICD-10-CM

## 2015-08-12 HISTORY — DX: Other specified disorders of bone density and structure, unspecified site: M85.80

## 2016-01-13 ENCOUNTER — Ambulatory Visit: Payer: Medicare Other | Admitting: Obstetrics and Gynecology

## 2016-02-04 ENCOUNTER — Ambulatory Visit (INDEPENDENT_AMBULATORY_CARE_PROVIDER_SITE_OTHER): Payer: Medicare Other | Admitting: Obstetrics and Gynecology

## 2016-02-04 ENCOUNTER — Encounter: Payer: Self-pay | Admitting: Obstetrics and Gynecology

## 2016-02-04 VITALS — BP 122/80 | HR 66 | Resp 16 | Ht 64.5 in | Wt 121.0 lb

## 2016-02-04 DIAGNOSIS — Z01419 Encounter for gynecological examination (general) (routine) without abnormal findings: Secondary | ICD-10-CM

## 2016-02-04 NOTE — Patient Instructions (Signed)

## 2016-02-04 NOTE — Progress Notes (Signed)
70 y.o. G2P2 Married Caucasian female here for annual exam.    Patient did genetic testing due to personal and family history of breast cancer. BRCA negative.   Prefers not to treat osteopenia for fx risk reduction.   8 year old son just married.   Grandchildren are visiting for the weekend.   PCP: Darcus Austin, MD     Patient's last menstrual period was 05/22/2000 (approximate).           Sexually active: Yes.   female The current method of family planning is tubal ligation/postmenopausal    Exercising: Yes.    Walks 45 min.to 1hr daily and yoga Smoker:  no  Health Maintenance: Pap:  01-01-15 Neg History of abnormal Pap:  no MMG:  06-22-15 Left unilateral;Rt.mastectomy/Density B/Lt.br.possible asymmetry. 06-29-15 Diag.Lt.and U/S was indeterminate/BiRads4 and patient underwent U/S guided Lt.Br.core biopsy at 12:30 o'clock -- pathology revealed Fat Necrosis with Cystic Degeneration Colonoscopy:  2009 normal with Dr. Monna Fam due 11/2017. BMD:   08-12-15  Result:Osteopenia of hip.  Normal spine. :[facility]  TDaP:  12-27-12  Gardasil:   N/A HIV:  PCP.  Hep C:  PCP. Screening Labs:  Hb today: PCP, Urine today: PCP   reports that she has never smoked. She has never used smokeless tobacco. She reports that she does not drink alcohol or use drugs.  Past Medical History:  Diagnosis Date  . Breast cancer ([place])    right DCIS at age 83  . Hypertension   . Menopause 1998  . Osteopenia 2017   FRAX - increased risk of fracture    Past Surgical History:  Procedure Laterality Date  . AUGMENTATION MAMMAPLASTY Right 09/2008   right breast reconstruction  . MASTECTOMY Right 05/2008   DCIS Multicentric--Mastectomy  (neg) nodes---no tamox  . TUBAL LIGATION     BTSP    Current Outpatient Prescriptions  Medication Sig Dispense Refill  . hydrochlorothiazide (MICROZIDE) 12.5 MG capsule daily.     . ramipril (ALTACE) 10 MG tablet Take 10 mg by mouth daily.    . valACYclovir (VALTREX)  1000 MG tablet Take 1 tablet by mouth as needed. Take 1 tablet prn for cold sores  0  . Vitamin D, Ergocalciferol, (DRISDOL) 50000 UNITS CAPS capsule Patient take 1 every other week.     No current facility-administered medications for this visit.     Family History  Problem Relation Age of Onset  . Osteoporosis Mother   . Heart disease Father     died of heart attack age 7  . Breast cancer Maternal Grandmother 80  . Breast cancer Cousin 85    maternal 1st cousin  . Osteoporosis Maternal Aunt     ROS:  Pertinent items are noted in HPI.  Otherwise, a comprehensive ROS was negative.  Exam:   BP 122/80 (BP Location: Right Arm, Patient Position: Sitting, Cuff Size: Normal)   Pulse 66   Resp 16   Ht 5' 4.5" (1.638 m)   Wt 121 lb (54.9 kg)   LMP 05/22/2000 (Approximate)   BMI 20.45 kg/m     General appearance: alert, cooperative and appears stated age Head: Normocephalic, without obvious abnormality, atraumatic Neck: no adenopathy, supple, symmetrical, trachea midline and thyroid normal to inspection and palpation Lungs: clear to auscultation bilaterally Breasts: normal appearance, no masses or tenderness, No nipple retraction or dimpling, No nipple discharge or bleeding, No axillary or supraclavicular adenopathy on left.  Right breast absent and implant in place.  No masses and no  axillary nodes. Heart: regular rate and rhythm Abdomen: soft, non-tender; no masses, no organomegaly Extremities: extremities normal, atraumatic, no cyanosis or edema Skin: Skin color, texture, turgor normal. No rashes or lesions Lymph nodes: Cervical, supraclavicular, and axillary nodes normal. No abnormal inguinal nodes palpated Neurologic: Grossly normal  Pelvic: External genitalia:  no lesions              Urethra:  normal appearing urethra with no masses, tenderness or lesions              Bartholins and Skenes: normal                 Vagina: normal appearing vagina with normal color and  discharge, no lesions              Cervix: no lesions              Pap taken: Yes.   Bimanual Exam:  Uterus:  normal size, contour, position, consistency, mobility, non-tender              Adnexa: no mass, fullness, tenderness              Rectal exam: Yes.  .  Confirms.              Anus:  normal sphincter tone, no lesions  Chaperone was present for exam.  Assessment:   Well woman visit with normal exam. Hx right breast cancer.  DCIS.  Status post right mastectomy with reconstruction.  Osteopenia. Increased fx risk by FRAX.  Declines tx.   Plan: Yearly mammogram recommended after age 30.  Due in Feb. 2018.  Recommended self breast exam.  Pap and HR HPV as above. Discussed Calcium, Vitamin D, regular exercise program including cardiovascular and weight bearing exercise. BMD in 2019.  Follow up annually and prn.     After visit summary provided.

## 2016-06-15 ENCOUNTER — Other Ambulatory Visit: Payer: Self-pay | Admitting: Obstetrics and Gynecology

## 2016-06-15 DIAGNOSIS — Z1231 Encounter for screening mammogram for malignant neoplasm of breast: Secondary | ICD-10-CM

## 2016-06-15 DIAGNOSIS — Z9011 Acquired absence of right breast and nipple: Secondary | ICD-10-CM

## 2016-07-11 ENCOUNTER — Ambulatory Visit
Admission: RE | Admit: 2016-07-11 | Discharge: 2016-07-11 | Disposition: A | Discharge status: Home / Self Care | Payer: Medicare Other | Source: Ambulatory Visit | Attending: Obstetrics and Gynecology | Admitting: Obstetrics and Gynecology

## 2016-07-11 DIAGNOSIS — Z9011 Acquired absence of right breast and nipple: Secondary | ICD-10-CM

## 2016-07-11 DIAGNOSIS — Z1231 Encounter for screening mammogram for malignant neoplasm of breast: Secondary | ICD-10-CM

## 2017-02-01 NOTE — Progress Notes (Signed)
71 y.o. G2P2 Married Caucasian female here for annual exam.    No vaginal bleeding.   Hx DCIS.  BRCA negative.   Brought cholesterol down 50 points through diet alone.   Has declined tx of osteopenia.   Went to [place] to meet her daughter in [place] family.   Doing labs with PCP.   PCP:  Darcus Austin, MD    Patient's last menstrual period was 05/22/2000 (approximate).           Sexually active: Yes.   female The current method of family planning is tubal ligation.    Exercising: Yes.    Walking, yoga and weights.   Smoker:  no  Health Maintenance: Pap: 01-01-15 Neg History of abnormal Pap:  no MMG: 07-11-16 Unilateral Left--Rt.mastectomy/DensityB Neg/BiRads1:TBC Colonoscopy:   2009 normal with Dr. Monna Fam due 11/2017. BMD: 08-12-15  Result: Osteopenia of hip.  Normal spine. [facility]  TDaP: 12-27-12 Gardasil:   no HIV:PCP Hep C:PCP Screening Labs:  Hb today: PCP, Urine today: not done   reports that she has never smoked. She has never used smokeless tobacco. She reports that she does not drink alcohol or use drugs.  Past Medical History:  Diagnosis Date  . Breast cancer ([place])    right DCIS at age 15  . HSV-1 infection   . Hypertension   . Menopause 1998  . Osteopenia 2017   FRAX - increased risk of fracture    Past Surgical History:  Procedure Laterality Date  . AUGMENTATION MAMMAPLASTY Right 09/2008   right breast reconstruction  . MASTECTOMY Right 05/2008   DCIS Multicentric--Mastectomy  (neg) nodes---no tamox  . TUBAL LIGATION     BTSP    Current Outpatient Prescriptions  Medication Sig Dispense Refill  . hydrochlorothiazide (MICROZIDE) 12.5 MG capsule daily.     . ramipril (ALTACE) 10 MG tablet Take 10 mg by mouth daily.    . valACYclovir (VALTREX) 1000 MG tablet Take 1 tablet by mouth as needed. Take 1 tablet prn for cold sores  0  . Vitamin D, Ergocalciferol, (DRISDOL) 50000 UNITS CAPS capsule Patient take 1 every other week.     No current  facility-administered medications for this visit.     Family History  Problem Relation Age of Onset  . Osteoporosis Mother   . Heart disease Father        died of heart attack age 55  . Breast cancer Maternal Grandmother 80  . Breast cancer Cousin 49       maternal 1st cousin  . Osteoporosis Maternal Aunt     ROS:  Pertinent items are noted in HPI.  Otherwise, a comprehensive ROS was negative.  Exam:   BP 130/70 (BP Location: Right Arm, Patient Position: Sitting, Cuff Size: Normal)   Pulse 66   Resp 14   Ht 5' 4.5" (1.638 m)   Wt 121 lb 12.8 oz (55.2 kg)   LMP 05/22/2000 (Approximate)   BMI 20.58 kg/m     General appearance: alert, cooperative and appears stated age Head: Normocephalic, without obvious abnormality, atraumatic Neck: no adenopathy, supple, symmetrical, trachea midline and thyroid normal to inspection and palpation Lungs: clear to auscultation bilaterally Breasts: right breast absent and has implant.  Left breast with normal appearance, no masses or tenderness, No nipple retraction or dimpling, No nipple discharge or bleeding, No axillary or supraclavicular adenopathy Heart: regular rate and rhythm Abdomen: soft, non-tender; no masses, no organomegaly Extremities: extremities normal, atraumatic, no cyanosis or edema Skin: Skin  color, texture, turgor normal. No rashes or lesions Lymph nodes: Cervical, supraclavicular, and axillary nodes normal. No abnormal inguinal nodes palpated Neurologic: Grossly normal  Pelvic: External genitalia:  no lesions              Urethra:  normal appearing urethra with no masses, tenderness or lesions              Bartholins and Skenes: normal                 Vagina: normal appearing vagina with normal color and discharge, no lesions              Cervix: no lesions              Pap taken: Yes.   Bimanual Exam:  Uterus:  normal size, contour, position, consistency, mobility, non-tender              Adnexa: no mass, fullness,  tenderness              Rectal exam: Yes.  .  Confirms.              Anus:  normal sphincter tone, no lesions  Chaperone was present for exam.  Assessment:   Well woman visit with normal exam. Hx right breast cancer. DCIS. Status post right mastectomy with reconstruction.  Osteopenia. Increased fx risk by FRAX.  Declines tx.  Hx HSV I.   Valtrex through PCP.  Plan: Mammogram screening discussed. Recommended self breast awareness. Pap and HR HPV as above. Guidelines for Calcium, Vitamin D, regular exercise program including cardiovascular and weight bearing exercise. BMD next year.  Labs with PCP.  Colonoscopy next year. Follow up annually and prn.   After visit summary provided.

## 2017-02-07 ENCOUNTER — Other Ambulatory Visit (HOSPITAL_COMMUNITY)
Admission: RE | Admit: 2017-02-07 | Discharge: 2017-02-07 | Disposition: A | Discharge status: Home / Self Care | Payer: Medicare Other | Source: Ambulatory Visit | Attending: Obstetrics and Gynecology | Admitting: Obstetrics and Gynecology

## 2017-02-07 ENCOUNTER — Encounter: Payer: Self-pay | Admitting: Obstetrics and Gynecology

## 2017-02-07 ENCOUNTER — Ambulatory Visit (INDEPENDENT_AMBULATORY_CARE_PROVIDER_SITE_OTHER): Payer: Medicare Other | Admitting: Obstetrics and Gynecology

## 2017-02-07 VITALS — BP 130/70 | HR 66 | Resp 14 | Ht 64.5 in | Wt 121.8 lb

## 2017-02-07 DIAGNOSIS — Z780 Asymptomatic menopausal state: Secondary | ICD-10-CM | POA: Diagnosis not present

## 2017-02-07 DIAGNOSIS — Z01419 Encounter for gynecological examination (general) (routine) without abnormal findings: Secondary | ICD-10-CM | POA: Diagnosis not present

## 2017-02-07 DIAGNOSIS — Z124 Encounter for screening for malignant neoplasm of cervix: Secondary | ICD-10-CM | POA: Diagnosis present

## 2017-02-07 NOTE — Patient Instructions (Signed)

## 2017-02-08 LAB — CYTOLOGY - PAP: Diagnosis: NEGATIVE

## 2017-02-12 ENCOUNTER — Other Ambulatory Visit: Payer: Self-pay | Admitting: Obstetrics and Gynecology

## 2017-02-12 DIAGNOSIS — E2839 Other primary ovarian failure: Secondary | ICD-10-CM

## 2017-06-19 ENCOUNTER — Other Ambulatory Visit: Payer: Self-pay | Admitting: Obstetrics and Gynecology

## 2017-06-19 DIAGNOSIS — Z1231 Encounter for screening mammogram for malignant neoplasm of breast: Secondary | ICD-10-CM

## 2017-08-13 ENCOUNTER — Ambulatory Visit
Admission: RE | Admit: 2017-08-13 | Discharge: 2017-08-13 | Disposition: A | Discharge status: Home / Self Care | Payer: Medicare Other | Source: Ambulatory Visit | Attending: Obstetrics and Gynecology | Admitting: Obstetrics and Gynecology

## 2017-08-13 DIAGNOSIS — Z1231 Encounter for screening mammogram for malignant neoplasm of breast: Secondary | ICD-10-CM

## 2017-08-13 DIAGNOSIS — E2839 Other primary ovarian failure: Secondary | ICD-10-CM

## 2017-08-29 ENCOUNTER — Telehealth: Payer: Self-pay | Admitting: Obstetrics and Gynecology

## 2017-08-29 NOTE — Telephone Encounter (Signed)
Patient cancelled bmd consult appointment and will call back to reschedule.

## 2017-08-29 NOTE — Telephone Encounter (Signed)
Thank you for the update.  I closed the encounter.

## 2017-08-30 ENCOUNTER — Ambulatory Visit: Payer: Medicare Other | Admitting: Obstetrics and Gynecology

## 2017-10-23 ENCOUNTER — Encounter: Payer: Self-pay | Admitting: Internal Medicine

## 2017-10-25 ENCOUNTER — Telehealth: Payer: Self-pay | Admitting: Obstetrics and Gynecology

## 2017-10-25 ENCOUNTER — Encounter: Payer: Self-pay | Admitting: Obstetrics and Gynecology

## 2017-10-25 ENCOUNTER — Other Ambulatory Visit: Payer: Self-pay

## 2017-10-25 ENCOUNTER — Ambulatory Visit: Payer: Self-pay | Admitting: Obstetrics and Gynecology

## 2017-10-25 ENCOUNTER — Ambulatory Visit: Payer: Medicare Other | Admitting: Obstetrics and Gynecology

## 2017-10-25 VITALS — BP 142/70 | HR 76 | Resp 16 | Ht 64.5 in | Wt 123.0 lb

## 2017-10-25 DIAGNOSIS — N8110 Cystocele, unspecified: Secondary | ICD-10-CM | POA: Diagnosis not present

## 2017-10-25 NOTE — Telephone Encounter (Signed)
Patient called and stated that she had spoken with Dr. Quincy Simmonds about the option of surgery. She would has a few follow up questions and would like to know how soon surgery could be scheduled, and what the average recovery time would be.

## 2017-10-25 NOTE — Telephone Encounter (Signed)
Return call to patient. General questions regarding surgery scheduling, surgical recovery, lifting restrictions and travel post op answered for patient. Is considering late summer or fall but undecided at this time.   Routing to provider for final review. Patient agreeable to disposition. Will close encounter.

## 2017-10-25 NOTE — Telephone Encounter (Signed)
Spoke with patient. Could feel "bulge or mass" coming from vagina when wiping this morning. Is the first time she has noticed it. Unable to visualize. Request OV.  Denies any difficulty voiding or urinary complainants. No pain pressure or bleeding.   OV scheduled for today at 11:30am with Dr. Quincy Simmonds.  Routing to provider for final review. Patient is agreeable to disposition. Will close encounter.

## 2017-10-25 NOTE — Telephone Encounter (Signed)
Routing to S. Orvan Seen, RN for return call regarding surgery planning.

## 2017-10-25 NOTE — Progress Notes (Signed)
GYNECOLOGY  VISIT   HPI: 72 y.o.   Married  Caucasian  female   G2P2 with Patient's last menstrual period was 05/22/2000 (approximate).   here for possible cystocele. Noticed this with showering today.  No pain.  No problems with bladder control or emptying.  BM are passing [place]. No vaginal bleeding or spotting.  Not as sexually active at this time.    No change in activity level other than walking more.   Going to [place] in July with family.   GYNECOLOGIC HISTORY: Patient's last menstrual period was 05/22/2000 (approximate). Contraception:  Tubal ligation Menopausal hormone therapy:  none Last mammogram:  08/13/17 BIRADS 1 negative/density a Last pap smear:   02/07/17 Pap smear Negative        OB History    Gravida  2   Para  2   Term      Preterm      AB      Living  2     SAB      TAB      Ectopic      Multiple      Live Births                 Patient Active Problem List   Diagnosis Date Noted  . Family history of brain cancer 10/04/2012  . Breast cancer ([place]) 10/04/2012    Past Medical History:  Diagnosis Date  . Breast cancer ([place])    right DCIS at age 36  . HSV-1 infection   . Hypertension   . Menopause 1998  . Osteopenia 2017   FRAX - increased risk of fracture    Past Surgical History:  Procedure Laterality Date  . AUGMENTATION MAMMAPLASTY Right 09/2008   right breast reconstruction  . MASTECTOMY Right 05/2008   DCIS Multicentric--Mastectomy  (neg) nodes---no tamox  . TUBAL LIGATION     BTSP    Current Outpatient Medications  Medication Sig Dispense Refill  . cetirizine (ZYRTEC) 10 MG tablet Take 10 mg by mouth as needed for allergies.    . fluticasone (FLONASE) 50 MCG/ACT nasal spray Place into both nostrils as needed for allergies or rhinitis.    . hydrochlorothiazide (MICROZIDE) 12.5 MG capsule daily.     . ramipril (ALTACE) 10 MG tablet Take 10 mg by mouth daily.    . valACYclovir (VALTREX) 1000 MG tablet Take 1 tablet by  mouth as needed. Take 1 tablet prn for cold sores  0  . Vitamin D, Ergocalciferol, (DRISDOL) 50000 UNITS CAPS capsule Patient take 1 every other week.     No current facility-administered medications for this visit.      ALLERGIES: Tetracyclines & related  Family History  Problem Relation Age of Onset  . Osteoporosis Mother   . Heart disease Father        died of heart attack age 77  . Breast cancer Maternal Grandmother 80  . Breast cancer Cousin 79       maternal 1st cousin  . Osteoporosis Maternal Aunt     Social History   Socioeconomic History  . Marital status: Married    Spouse name: Not on file  . Number of children: Not on file  . Years of education: Not on file  . Highest education level: Not on file  Occupational History  . Occupation: retired    Fish farm manager: [employer]  Social Needs  . Financial resource strain: Not on file  . Food insecurity:    Worry:  Not on file    Inability: Not on file  . Transportation needs:    Medical: Not on file    Non-medical: Not on file  Tobacco Use  . Smoking status: Never Smoker  . Smokeless tobacco: Never Used  Substance and Sexual Activity  . Alcohol use: No    Alcohol/week: 0.0 oz  . Drug use: No  . Sexual activity: Yes    Partners: Male    Birth control/protection: Post-menopausal, Surgical    Comment: Tubal  Lifestyle  . Physical activity:    Days per week: Not on file    Minutes per session: Not on file  . Stress: Not on file  Relationships  . Social connections:    Talks on phone: Not on file    Gets together: Not on file    Attends religious service: Not on file    Active member of club or organization: Not on file    Attends meetings of clubs or organizations: Not on file    Relationship status: Not on file  . Intimate partner violence:    Fear of current or ex partner: Not on file    Emotionally abused: Not on file    Physically abused: Not on file    Forced sexual activity: Not on file  Other  Topics Concern  . Not on file  Social History Narrative  . Not on file    Review of Systems  Constitutional: Negative.   HENT: Negative.   Eyes: Negative.   Respiratory: Negative.   Cardiovascular: Negative.   Gastrointestinal: Negative.   Endocrine: Negative.   Genitourinary: Negative.   Musculoskeletal: Negative.   Skin: Negative.   Allergic/Immunologic: Negative.   Neurological: Negative.   Hematological: Negative.   Psychiatric/Behavioral: Negative.     PHYSICAL EXAMINATION:    BP (!) 142/70 (BP Location: Right Arm, Patient Position: Sitting, Cuff Size: Normal)   Pulse 76   Resp 16   Ht 5' 4.5" (1.638 m)   Wt 123 lb (55.8 kg)   LMP 05/22/2000 (Approximate)   BMI 20.79 kg/m     General appearance: alert, cooperative and appears stated age  Pelvic: External genitalia:  no lesions              Urethra:  normal appearing urethra with no masses, tenderness or lesions              Bartholins and Skenes: normal                 Vagina: normal appearing vagina with normal color and discharge, no lesions.  Second degree cystocele.  Minimal uterine prolapse.  No rectocele.               Cervix: no lesions                Bimanual Exam:  Uterus:  normal size, contour, position, consistency, mobility, non-tender              Adnexa: no mass, fullness, tenderness              Rectal exam: Yes.  .  Confirms.              Anus:  normal sphincter tone, no lesions  Chaperone was present for exam.  ASSESSMENT  Cystocele.  PLAN  We had a comprehensive discussion regarding pelvic organ prolapse including risk factors, symptoms, progression, and treatment options.  Treatment may include observational management, pelvic floor therapy, pessary, avoidance of straining, and  surgical repair.  Questions invited and answered. ACOG HO on prolapse.  Keep annual exam appt.   An After Visit Summary was printed and given to the patient.  __25____ minutes face to face time of which over  50% was spent in counseling.

## 2017-10-25 NOTE — Telephone Encounter (Signed)
Patient noticed this morning that she has swelling around her vagina. Would like to speak with a nurse to possibly be seen.

## 2017-10-30 ENCOUNTER — Telehealth: Payer: Self-pay | Admitting: Obstetrics and Gynecology

## 2017-10-30 NOTE — Telephone Encounter (Signed)
Patient has decided to proceed with pessary fitting before surgery.

## 2017-10-30 NOTE — Telephone Encounter (Signed)
Spoke with patient. Patient would like to schedule a pessary fitting. States she is going out of town and would like to try to have some relief of symptoms in the interim before she can have surgery. Appointment scheduled for 6/18/22019 at 10 am with Dr.Silva. Patient is agreeable to date and time.  Routing to provider for final review. Patient agreeable to disposition. Will close encounter.

## 2017-11-06 ENCOUNTER — Other Ambulatory Visit: Payer: Self-pay

## 2017-11-06 ENCOUNTER — Encounter: Payer: Self-pay | Admitting: Obstetrics and Gynecology

## 2017-11-06 ENCOUNTER — Ambulatory Visit: Payer: Medicare Other | Admitting: Obstetrics and Gynecology

## 2017-11-06 VITALS — BP 128/76 | HR 68 | Resp 14 | Ht 64.5 in | Wt 123.0 lb

## 2017-11-06 DIAGNOSIS — N8110 Cystocele, unspecified: Secondary | ICD-10-CM | POA: Diagnosis not present

## 2017-11-06 NOTE — Progress Notes (Signed)
GYNECOLOGY  VISIT   HPI: 72 y.o.   Married  Caucasian  female   G2P2 with Patient's last menstrual period was 05/22/2000 (approximate).   here for pessary fitting.  Second degree cystocele and minimal uterine prolapse.  No rectocele.  No leakage of urine now or in past.  Able to empty bladder and rectum.  Stool softener helps with her BMs. No recurrent UTIs.   Traveling to [place] on November 27, 2017.  Has been stressed about the prolapse suddenly getting worse during travel.   Considering surgery for the prolapse.   Colonoscopy scheduled for August.   GYNECOLOGIC HISTORY: Patient's last menstrual period was 05/22/2000 (approximate). Contraception:  Tubal ligation Menopausal hormone therapy:  none Last mammogram:  08/13/17 BIRADS 1 negative/density a Last pap smear:   02/07/17 Pap smear Negative        OB History    Gravida  2   Para  2   Term      Preterm      AB      Living  2     SAB      TAB      Ectopic      Multiple      Live Births                 Patient Active Problem List   Diagnosis Date Noted  . Family history of brain cancer 10/04/2012  . Breast cancer ([place]) 10/04/2012    Past Medical History:  Diagnosis Date  . Breast cancer ([place])    right DCIS at age 65  . HSV-1 infection   . Hypertension   . Menopause 1998  . Osteopenia 2017   FRAX - increased risk of fracture    Past Surgical History:  Procedure Laterality Date  . AUGMENTATION MAMMAPLASTY Right 09/2008   right breast reconstruction  . MASTECTOMY Right 05/2008   DCIS Multicentric--Mastectomy  (neg) nodes---no tamox  . TUBAL LIGATION     BTSP    Current Outpatient Medications  Medication Sig Dispense Refill  . cetirizine (ZYRTEC) 10 MG tablet Take 10 mg by mouth as needed for allergies.    . fluticasone (FLONASE) 50 MCG/ACT nasal spray Place into both nostrils as needed for allergies or rhinitis.    . hydrochlorothiazide (MICROZIDE) 12.5 MG capsule daily.     . ramipril  (ALTACE) 10 MG tablet Take 10 mg by mouth daily.    . valACYclovir (VALTREX) 1000 MG tablet Take 1 tablet by mouth as needed. Take 1 tablet prn for cold sores  0  . Vitamin D, Ergocalciferol, (DRISDOL) 50000 UNITS CAPS capsule Patient take 1 every other week.     No current facility-administered medications for this visit.      ALLERGIES: Tetracyclines & related  Family History  Problem Relation Age of Onset  . Osteoporosis Mother   . Heart disease Father        died of heart attack age 57  . Breast cancer Maternal Grandmother 80  . Breast cancer Cousin 35       maternal 1st cousin  . Osteoporosis Maternal Aunt     Social History   Socioeconomic History  . Marital status: Married    Spouse name: Not on file  . Number of children: Not on file  . Years of education: Not on file  . Highest education level: Not on file  Occupational History  . Occupation: retired    Fish farm manager: [employer]  Social Needs  .  Financial resource strain: Not on file  . Food insecurity:    Worry: Not on file    Inability: Not on file  . Transportation needs:    Medical: Not on file    Non-medical: Not on file  Tobacco Use  . Smoking status: Never Smoker  . Smokeless tobacco: Never Used  Substance and Sexual Activity  . Alcohol use: No    Alcohol/week: 0.0 oz  . Drug use: No  . Sexual activity: Yes    Partners: Male    Birth control/protection: Post-menopausal, Surgical    Comment: Tubal  Lifestyle  . Physical activity:    Days per week: Not on file    Minutes per session: Not on file  . Stress: Not on file  Relationships  . Social connections:    Talks on phone: Not on file    Gets together: Not on file    Attends religious service: Not on file    Active member of club or organization: Not on file    Attends meetings of clubs or organizations: Not on file    Relationship status: Not on file  . Intimate partner violence:    Fear of current or ex partner: Not on file     Emotionally abused: Not on file    Physically abused: Not on file    Forced sexual activity: Not on file  Other Topics Concern  . Not on file  Social History Narrative  . Not on file    Review of Systems  Constitutional: Negative.   HENT: Negative.   Eyes: Negative.   Respiratory: Negative.   Cardiovascular: Negative.   Gastrointestinal: Negative.   Endocrine: Negative.   Genitourinary: Negative.   Musculoskeletal: Negative.   Skin: Negative.   Allergic/Immunologic: Negative.   Neurological: Negative.   Hematological: Negative.   Psychiatric/Behavioral: Negative.     PHYSICAL EXAMINATION:    BP 128/76 (BP Location: Right Arm, Patient Position: Sitting, Cuff Size: Normal)   Pulse 68   Resp 14   Ht 5' 4.5" (1.638 m)   Wt 123 lb (55.8 kg)   LMP 05/22/2000 (Approximate)   BMI 20.79 kg/m     General appearance: alert, cooperative and appears stated age    Pelvic: External genitalia:  no lesions              Urethra:  normal appearing urethra with no masses, tenderness or lesions              Bartholins and Skenes: normal                 Vagina: normal appearing vagina with normal color and discharge, no lesions              Cervix: no lesions                Bimanual Exam:  Uterus:  normal size, contour, position, consistency, mobility, non-tender              Adnexa: no mass, fullness, tenderness             Pessary ring with support #1, #2, #3 all uncomfortable and caused discomfort on the bladder.  #1 ring with support most comfortable but patient able to expel with Valsalva in lithotomy position.   Chaperone was present for exam.  ASSESSMENT  Hx mastectomy from DCIS.  Bladder prolapse. Not a good candidate for pessary.  Atrophy may be playing a role in her tolerance of a pessary.  PLAN  I do not recommend local vaginal estrogen due to her hx of DCIS.  Patient is considering pelvic floor physical therapy, and I discussed the benefits of this with her.  AEX  in September 2019, and she will revisit surgery options then.  Her current needs would appear to be an anterior colporrhaphy with cystoscopy. An After Visit Summary was printed and given to the patient.  __25____ minutes face to face time of which over 50% was spent in counseling.

## 2017-11-19 ENCOUNTER — Telehealth: Payer: Self-pay | Admitting: Obstetrics and Gynecology

## 2017-11-19 DIAGNOSIS — N8110 Cystocele, unspecified: Secondary | ICD-10-CM

## 2017-11-19 NOTE — Telephone Encounter (Signed)
Patient has an appointment tomorrow with Kendra Velazquez at [facility]. Patient just learned that she will need a referral due to her insurance coverage. [name] is out of the office today.

## 2017-11-19 NOTE — Telephone Encounter (Signed)
Spoke with patient. Patient states she is scheduled on 11/20/17 at 10am with Ileana Roup for pelvic floor PT. Patient states she was initially advised when scheduling no referral required, now needs referral.   Per review of OV notes dated 11/06/17, pelvic floor therapy discussed.   Advised patient will place order for referral and fax to [facility], will return call if any additional recommendations provided after Dr. Quincy Simmonds has reviewed. Patient agreeable.  Order placed for referral to PT/ Ileana Roup. OV notes, referral  and demographics faxed to [facility].    Routing to provider for final review. Patient is agreeable to disposition. Will close encounter.  Cc: [name]

## 2017-12-25 ENCOUNTER — Ambulatory Visit (AMBULATORY_SURGERY_CENTER): Payer: Self-pay

## 2017-12-25 VITALS — Ht 64.5 in | Wt 126.6 lb

## 2017-12-25 DIAGNOSIS — Z1211 Encounter for screening for malignant neoplasm of colon: Secondary | ICD-10-CM

## 2017-12-25 MED ORDER — NA SULFATE-K SULFATE-MG SULF 17.5-3.13-1.6 GM/177ML PO SOLN: 1.0000 | Freq: Once | ORAL | 0 refills | Status: AC

## 2017-12-25 NOTE — Progress Notes (Signed)
Denies allergies to eggs or soy products. Denies complication of anesthesia or sedation. Denies use of weight loss medication. Denies use of O2.   Emmi instructions declined.  

## 2017-12-26 ENCOUNTER — Encounter: Payer: Self-pay | Admitting: Internal Medicine

## 2018-01-08 ENCOUNTER — Encounter: Payer: Self-pay | Admitting: Internal Medicine

## 2018-01-08 ENCOUNTER — Ambulatory Visit (AMBULATORY_SURGERY_CENTER): Payer: Medicare Other | Admitting: Internal Medicine

## 2018-01-08 VITALS — BP 149/81 | HR 56 | Temp 97.8°F | Resp 14 | Ht 64.0 in | Wt 123.0 lb

## 2018-01-08 DIAGNOSIS — Z1211 Encounter for screening for malignant neoplasm of colon: Secondary | ICD-10-CM

## 2018-01-08 DIAGNOSIS — D120 Benign neoplasm of cecum: Secondary | ICD-10-CM

## 2018-01-08 MED ORDER — SODIUM CHLORIDE 0.9 % IV SOLN: 500.0000 mL | Freq: Once | INTRAVENOUS | Status: DC

## 2018-01-08 NOTE — Patient Instructions (Signed)
Please read handouts on Polyps, Diverticulosis, and Hemorrhoids. Continue present medications.    YOU HAD AN ENDOSCOPIC PROCEDURE TODAY AT [facility]:   Refer to the procedure report that was given to you for any specific questions about what was found during the examination.  If the procedure report does not answer your questions, please call your gastroenterologist to clarify.  If you requested that your care partner not be given the details of your procedure findings, then the procedure report has been included in a sealed envelope for you to review at your convenience later.  YOU SHOULD EXPECT: Some feelings of bloating in the abdomen. Passage of more gas than usual.  Walking can help get rid of the air that was put into your GI tract during the procedure and reduce the bloating. If you had a lower endoscopy (such as a colonoscopy or flexible sigmoidoscopy) you may notice spotting of blood in your stool or on the toilet paper. If you underwent a bowel prep for your procedure, you may not have a normal bowel movement for a few days.  Please Note:  You might notice some irritation and congestion in your nose or some drainage.  This is from the oxygen used during your procedure.  There is no need for concern and it should clear up in a day or so.  SYMPTOMS TO REPORT IMMEDIATELY:   Following lower endoscopy (colonoscopy or flexible sigmoidoscopy):  Excessive amounts of blood in the stool  Significant tenderness or worsening of abdominal pains  Swelling of the abdomen that is new, acute  Fever of 100F or higher    For urgent or emergent issues, a gastroenterologist can be reached at any hour by calling [phone].   DIET:  We do recommend a small meal at first, but then you may proceed to your regular diet.  Drink plenty of fluids but you should avoid alcoholic beverages for 24 hours.  ACTIVITY:  You should plan to take it easy for the rest of today and you should NOT  DRIVE or use heavy machinery until tomorrow (because of the sedation medicines used during the test).    FOLLOW UP: Our staff will call the number listed on your records the next business day following your procedure to check on you and address any questions or concerns that you may have regarding the information given to you following your procedure. If we do not reach you, we will leave a message.  However, if you are feeling well and you are not experiencing any problems, there is no need to return our call.  We will assume that you have returned to your regular daily activities without incident.  If any biopsies were taken you will be contacted by phone or by letter within the next 1-3 weeks.  Please call us at [phone] if you have not heard about the biopsies in 3 weeks.    SIGNATURES/CONFIDENTIALITY: You and/or your care partner have signed paperwork which will be entered into your electronic medical record.  These signatures attest to the fact that that the information above on your After Visit Summary has been reviewed and is understood.  Full responsibility of the confidentiality of this discharge information lies with you and/or your care-partner.

## 2018-01-08 NOTE — Progress Notes (Signed)
Report to PACU, RN, vss, BBS= Clear.  

## 2018-01-08 NOTE — Op Note (Signed)
[facility] Patient Name: Kendra Velazquez Procedure Date: 01/08/2018 9:52 AM MRN: [id number] Endoscopist: Docia Chuck. Henrene Pastor , MD Age: 71 Referring MD:  Date of Birth: 08/01/1945 Gender: Female Account #: [id number] Procedure:                Colonoscopy, with cold snare polypectomy x 1 Indications:              Screening for colorectal malignant neoplasm.                            Previous negative examinations 2004 and 2009 Medicines:                Monitored Anesthesia Care Procedure:                Pre-Anesthesia Assessment:                           - Prior to the procedure, a History and Physical                            was performed, and patient medications and                            allergies were reviewed. The patient's tolerance of                            previous anesthesia was also reviewed. The risks                            and benefits of the procedure and the sedation                            options and risks were discussed with the patient.                            All questions were answered, and informed consent                            was obtained. Prior Anticoagulants: The patient has                            taken no previous anticoagulant or antiplatelet                            agents. ASA Grade Assessment: II - A patient with                            mild systemic disease. After reviewing the risks                            and benefits, the patient was deemed in                            satisfactory condition to undergo the procedure.  After obtaining informed consent, the colonoscope                            was passed under direct vision. Throughout the                            procedure, the patient's blood pressure, pulse, and                            oxygen saturations were monitored continuously. The                            Colonoscope was introduced through the anus and            advanced to the the cecum, identified by                            appendiceal orifice and ileocecal valve. The                            ileocecal valve, appendiceal orifice, and rectum                            were photographed. The quality of the bowel                            preparation was excellent. The colonoscopy was                            performed without difficulty. The patient tolerated                            the procedure well. The bowel preparation used was                            SUPREP. Scope In: 9:57:08 AM Scope Out: 10:17:34 AM Scope Withdrawal Time: 0 hours 11 minutes 45 seconds  Total Procedure Duration: 0 hours 20 minutes 26 seconds  Findings:                 A 1 mm polyp was found in the ileocecal valve. The                            polyp was removed with a cold snare. Resection and                            retrieval were complete.                           A few small-mouthed diverticula were found in the                            sigmoid colon and ascending colon.  Internal hemorrhoids were found during                            retroflexion. The hemorrhoids were small. Complications:            No immediate complications. Estimated blood loss:                            None. Estimated Blood Loss:     Estimated blood loss: none. Impression:               - One 1 mm polyp at the ileocecal valve, removed                            with a cold snare. Resected and retrieved.                           - Diverticulosis in the sigmoid colon and in the                            ascending colon.                           - Internal hemorrhoids. Recommendation:           - Repeat colonoscopy in 5 years for surveillance if                            polyp adenomatous. Otherwise no routine                            surveillance recommended.                           - Patient has a contact number available for                             emergencies. The signs and symptoms of potential                            delayed complications were discussed with the                            patient. Return to normal activities tomorrow.                            Written discharge instructions were provided to the                            patient.                           - Resume previous diet.                           - Continue present medications.                           -  Await pathology results. Docia Chuck. Henrene Pastor, MD 01/08/2018 10:27:21 AM This report has been signed electronically.

## 2018-01-08 NOTE — Progress Notes (Signed)
Patient questioned regarding IV sticks to Right side, states ok to stick rt arm, no nodes taken.

## 2018-01-08 NOTE — Progress Notes (Signed)
Pt's states no medical or surgical changes since previsit or office visit. 

## 2018-01-08 NOTE — Progress Notes (Signed)
Called to room to assist during endoscopic procedure.  Patient ID and intended procedure confirmed with present staff. Received instructions for my participation in the procedure from the performing physician.  

## 2018-01-09 ENCOUNTER — Telehealth: Payer: Self-pay

## 2018-01-09 NOTE — Telephone Encounter (Signed)
Follow call made, left a message.

## 2018-01-09 NOTE — Telephone Encounter (Signed)
  Follow up Call-  Call back number 01/08/2018  Post procedure Call Back phone  # [phone] or [phone]  Permission to leave phone message Yes  Some recent data might be hidden     Patient questions:  Do you have a fever, pain , or abdominal swelling? No. Pain Score  0 *  Have you tolerated food without any problems? Yes.    Have you been able to return to your normal activities? Yes.    Do you have any questions about your discharge instructions: Diet   No. Medications  No. Follow up visit  No.  Do you have questions or concerns about your Care? No.  Actions: * If pain score is 4 or above: No action needed, pain <4.

## 2018-01-11 ENCOUNTER — Encounter: Payer: Self-pay | Admitting: Internal Medicine

## 2018-02-07 NOTE — Progress Notes (Signed)
72 y.o. G2P2 Married Caucasian female here for annual exam.    Doing physical therapy for her bladder prolapse. Good bladder control.  Taking colace for bowel function.  Has been doing PT for 2 months so far.  Sometimes she feels like she is getting a good response, and other times she feels like her bladder is hanging down.   Taking high dose vit D.   Enjoyed trip to [place].   Labs with PCP.   PCP:   Darcus Austin, MD   Patient's last menstrual period was 05/22/2000 (approximate).           Sexually active: No.  The current method of family planning is tubal ligation.    Exercising: Yes.    Home exercise routine includes walking daily  hrs per days, yoga and weights. Smoker:  no  Health Maintenance: Pap:  02/07/17 Normal  01/01/15 normal  History of abnormal Pap:  no MMG:  08/13/17 Bi-rads 1 neg  Colonoscopy:  01/08/18 43m Polyp.  Next due in 5 years.  BMD:   08/13/17  Result T-score of -1.8  TDaP:  12-27-12 Gardasil:   no HIV: PCP Hep C: PCP Screening Labs:  PCP.   reports that she has never smoked. She has never used smokeless tobacco. She reports that she does not drink alcohol or use drugs.  Past Medical History:  Diagnosis Date  . Allergy   . Breast cancer ([place]    right DCIS at age 72 . Cystocele with prolapse   . HSV-1 infection   . Hypertension   . Menopause 1998  . Osteopenia 2017   FRAX - increased risk of fracture    Past Surgical History:  Procedure Laterality Date  . AUGMENTATION MAMMAPLASTY Right 09/2008   right breast reconstruction  . MASTECTOMY Right 05/2008   DCIS Multicentric--Mastectomy  (neg) nodes---no tamox  . TUBAL LIGATION     BTSP    Current Outpatient Medications  Medication Sig Dispense Refill  . cetirizine (ZYRTEC) 10 MG tablet Take 10 mg by mouth as needed for allergies.    .Marland Kitchendocusate sodium (COLACE) 100 MG capsule Take 100 mg by mouth 2 (two) times daily.    . fluticasone (FLONASE) 50 MCG/ACT nasal spray Place into both nostrils  as needed for allergies or rhinitis.    . hydrochlorothiazide (MICROZIDE) 12.5 MG capsule daily.     . Multiple Vitamin (MULTIVITAMIN) tablet Take 1 tablet by mouth daily.    . ramipril (ALTACE) 10 MG tablet Take 10 mg by mouth daily.    . valACYclovir (VALTREX) 1000 MG tablet Take 1 tablet by mouth as needed. Take 1 tablet prn for cold sores  0  . Vitamin D, Ergocalciferol, (DRISDOL) 50000 UNITS CAPS capsule Patient take 1 every other week.     No current facility-administered medications for this visit.     Family History  Problem Relation Age of Onset  . Osteoporosis Mother   . Heart disease Father        died of heart attack age 71 . Breast cancer Maternal Grandmother 80  . Breast cancer Cousin 662      maternal 1st cousin  . Osteoporosis Maternal Aunt   . Colon cancer Neg Hx   . Esophageal cancer Neg Hx   . Rectal cancer Neg Hx   . Stomach cancer Neg Hx     Review of Systems  Exam:   BP 130/68   Pulse 72   Resp 14  Ht 5' 4.5" (1.638 m)   Wt 124 lb 6.4 oz (56.4 kg)   LMP 05/22/2000 (Approximate)   BMI 21.02 kg/m     General appearance: alert, cooperative and appears stated age Head: Normocephalic, without obvious abnormality, atraumatic Neck: no adenopathy, supple, symmetrical, trachea midline and thyroid normal to inspection and palpation Lungs: clear to auscultation bilaterally Breasts: left - normal appearance, no masses or tenderness, No nipple retraction or dimpling, No nipple discharge or bleeding, No axillary or supraclavicular adenopathy Right - absent with reconstruction.  No axillary or supraclavicular adenopathy.  Heart: regular rate and rhythm Abdomen: soft, non-tender; no masses, no organomegaly Extremities: extremities normal, atraumatic, no cyanosis or edema Skin: Skin color, texture, turgor normal. No rashes or lesions Lymph nodes: Cervical, supraclavicular, and axillary nodes normal. No abnormal inguinal nodes palpated Neurologic: Grossly  normal  Pelvic: External genitalia:  no lesions              Urethra:  normal appearing urethra with no masses, tenderness or lesions              Bartholins and Skenes: normal                 Vagina: normal appearing vagina with normal color and discharge, no lesions.  Second degree cystocele.              Cervix: no lesions              Pap taken: No. Bimanual Exam:  Uterus:  normal size, contour, position, consistency, mobility, non-tender              Adnexa: no mass, fullness, tenderness              Rectal exam: Yes.  .  Confirms.              Anus:  normal sphincter tone, no lesions  Chaperone was present for exam.  Assessment:   Well woman visit with normal exam. Hx mastectomy from DCIS.  BRCA neg.  Bladder prolapse. Not a good candidate for pessary.  Doing PT. Atrophy may be playing a role in her tolerance of a pessary.  Osteopenia.  Increased risk of fracture by FRAX model.  HSV 1.  Valtrex through PCP.   Plan: Mammogram screening. Recommended self breast awareness. Pap and HR HPV as above. Guidelines for Calcium, Vitamin D, regular exercise program including cardiovascular and weight bearing exercise. She is doing [employer].  She declines medical therapy for her osteopenia.  Flu vaccine discussed.  Labs through PCP.  Follow up annually and prn.   After visit summary provided.

## 2018-02-11 ENCOUNTER — Encounter: Payer: Self-pay | Admitting: Obstetrics and Gynecology

## 2018-02-11 ENCOUNTER — Ambulatory Visit: Payer: Medicare Other | Admitting: Obstetrics and Gynecology

## 2018-02-11 VITALS — BP 130/68 | HR 72 | Resp 14 | Ht 64.5 in | Wt 124.4 lb

## 2018-02-11 DIAGNOSIS — Z01419 Encounter for gynecological examination (general) (routine) without abnormal findings: Secondary | ICD-10-CM | POA: Diagnosis not present

## 2018-02-11 NOTE — Patient Instructions (Signed)

## 2018-07-18 ENCOUNTER — Other Ambulatory Visit: Payer: Self-pay | Admitting: Obstetrics and Gynecology

## 2018-07-18 DIAGNOSIS — Z1231 Encounter for screening mammogram for malignant neoplasm of breast: Secondary | ICD-10-CM

## 2018-07-23 ENCOUNTER — Telehealth: Payer: Self-pay | Admitting: Internal Medicine

## 2018-07-23 NOTE — Telephone Encounter (Signed)
Patients spouse is a patient of Dr. Jenny Reichmann.  Would like to know if Dr. Jenny Reichmann would take her on as a patient too.  Please advise.

## 2018-07-23 NOTE — Telephone Encounter (Signed)
Ok with me 

## 2018-07-24 NOTE — Telephone Encounter (Signed)
Patient has found another MD at [facility] .

## 2018-08-20 ENCOUNTER — Ambulatory Visit: Payer: Medicare Other

## 2018-10-08 ENCOUNTER — Ambulatory Visit: Payer: Medicare Other

## 2018-11-19 ENCOUNTER — Ambulatory Visit
Admission: RE | Admit: 2018-11-19 | Discharge: 2018-11-19 | Disposition: A | Discharge status: Home / Self Care | Payer: Medicare Other | Source: Ambulatory Visit | Attending: Obstetrics and Gynecology | Admitting: Obstetrics and Gynecology

## 2018-11-19 ENCOUNTER — Other Ambulatory Visit: Payer: Self-pay

## 2018-11-19 DIAGNOSIS — Z1231 Encounter for screening mammogram for malignant neoplasm of breast: Secondary | ICD-10-CM

## 2019-02-10 ENCOUNTER — Other Ambulatory Visit: Payer: Self-pay

## 2019-02-11 NOTE — Progress Notes (Signed)
73 y.o. G2P2 Married Caucasian female here for annual exam.    No vaginal bleeding or spotting.   Doing a lot of yard work during the pandemic.  She is back to doing [employer] during the pandemic.   Did PT for her bladder last summer.  Feels like she is managing her bladder ok.   Labs with PCP in Sept.  Cholesterol under control.  PCP:  Sela Hilding, MD   Patient's last menstrual period was 05/22/2000 (approximate).           Sexually active: No.  The current method of family planning is post menopausal status.    Exercising: Yes.    yoga, walking, weight lifting Smoker:  no  Health Maintenance: Pap:  02-07-17 negative           01-01-15 negative  History of abnormal Pap:  no MMG:  11-19-2018 density A/BIRADS 1 negative  Colonoscopy:  01-08-18 68m polyp. Next due in 5 years  BMD:   08-13-17  Result  T-score of -1.8 TDaP:  12-27-12 Gardasil:   no HIV: PCP Hep C: PCP Screening Labs:  Hb today: PCP, Urine today: not collected  Flu vaccine:  Recommended.    reports that she has never smoked. She has never used smokeless tobacco. She reports that she does not drink alcohol or use drugs.  Past Medical History:  Diagnosis Date  . Allergy   . Breast cancer ([place]    right DCIS at age 73 . Cystocele with prolapse   . HSV-1 infection   . Hypertension   . Menopause 1998  . Osteopenia 2017   FRAX - increased risk of fracture    Past Surgical History:  Procedure Laterality Date  . AUGMENTATION MAMMAPLASTY Right 09/2008   right breast reconstruction  . BREAST BIOPSY Left 2017  . MASTECTOMY Right 05/2008   DCIS Multicentric--Mastectomy  (neg) nodes---no tamox  . TUBAL LIGATION     BTSP    Current Outpatient Medications  Medication Sig Dispense Refill  . cetirizine (ZYRTEC) 10 MG tablet Take 10 mg by mouth as needed for allergies.    .Marland Kitchendocusate sodium (COLACE) 100 MG capsule Take 100 mg by mouth 2 (two) times daily.    . fluticasone (FLONASE) 50 MCG/ACT nasal spray  Place into both nostrils as needed for allergies or rhinitis.    . hydrochlorothiazide (MICROZIDE) 12.5 MG capsule daily.     . Multiple Vitamin (MULTIVITAMIN) tablet Take 1 tablet by mouth daily.    . ramipril (ALTACE) 10 MG tablet Take 10 mg by mouth daily.    . valACYclovir (VALTREX) 1000 MG tablet Take 1 tablet by mouth as needed. Take 1 tablet prn for cold sores  0  . VITAMIN D PO Take 2,000 Int'l Units by mouth.     No current facility-administered medications for this visit.     Family History  Problem Relation Age of Onset  . Osteoporosis Mother   . Heart disease Father        died of heart attack age 72 . Breast cancer Maternal Grandmother 80  . Breast cancer Cousin 621      maternal 1st cousin  . Osteoporosis Maternal Aunt   . Colon cancer Neg Hx   . Esophageal cancer Neg Hx   . Rectal cancer Neg Hx   . Stomach cancer Neg Hx     Review of Systems  Constitutional: Negative.   HENT: Negative.   Eyes: Negative.   Respiratory: Negative.  Cardiovascular: Negative.   Gastrointestinal: Negative.   Endocrine: Negative.   Genitourinary: Negative.   Musculoskeletal: Negative.   Skin: Negative.   Allergic/Immunologic: Negative.   Neurological: Negative.   Hematological: Negative.   Psychiatric/Behavioral: Negative.     Exam:   BP 136/84 (BP Location: Right Arm, Patient Position: Sitting, Cuff Size: Normal)   Pulse 62   Temp 98.6 F (37 C) (Skin)   Resp 14   Ht 5' 4.5" (1.638 m)   Wt 127 lb 4 oz (57.7 kg)   LMP 05/22/2000 (Approximate)   BMI 21.51 kg/m     General appearance: alert, cooperative and appears stated age Head: normocephalic, without obvious abnormality, atraumatic Neck: no adenopathy, supple, symmetrical, trachea midline and thyroid normal to inspection and palpation Lungs: clear to auscultation bilaterally Breasts: right breast absence with reconstruction.  Left breast with normal appearance, no masses or tenderness, No nipple retraction or  dimpling, No nipple discharge or bleeding, No axillary adenopathy Heart: regular rate and rhythm Abdomen: soft, non-tender; no masses, no organomegaly Extremities: extremities normal, atraumatic, no cyanosis or edema Skin: skin color, texture, turgor normal. No rashes or lesions Lymph nodes: cervical, supraclavicular, and axillary nodes normal. Neurologic: grossly normal  Pelvic: External genitalia:  no lesions              No abnormal inguinal nodes palpated.              Urethra:  normal appearing urethra with no masses, tenderness or lesions              Bartholins and Skenes: normal                 Vagina: normal appearing vagina with normal color and discharge, no lesions.  Atrophy noted.  First to second degree cystocele.              Cervix: no lesions.  Cervical stenosis.               Pap taken: Yes.   Bimanual Exam:  Uterus:  normal size, contour, position, consistency, mobility, non-tender              Adnexa: no mass, fullness, tenderness              Rectal exam: Yes.  .  Confirms.              Anus:  normal sphincter tone, no lesions  Chaperone was present for exam.  Assessment:   Well woman visit with normal exam. Hx mastectomy from DCIS.  BRCA neg.  Bladder prolapse.Not a good candidate for pessary.  Doing PT. Atrophy may be playing a role in her tolerance of a pessary.  Menopause.  Osteopenia.  Increased risk of fracture by FRAX model.  HSV 1.  Valtrex through PCP.   Plan: Mammogram screening discussed. Self breast awareness reviewed. Pap and HR HPV as above. Guidelines for Calcium, Vitamin D, regular exercise program including cardiovascular and weight bearing exercise. BMD next year.  Order placed.   Follow up annually and prn.    After visit summary provided.     

## 2019-02-13 ENCOUNTER — Encounter: Payer: Self-pay | Admitting: Obstetrics and Gynecology

## 2019-02-13 ENCOUNTER — Other Ambulatory Visit (HOSPITAL_COMMUNITY)
Admission: RE | Admit: 2019-02-13 | Discharge: 2019-02-13 | Disposition: A | Discharge status: Home / Self Care | Payer: Medicare Other | Source: Ambulatory Visit | Attending: Obstetrics and Gynecology | Admitting: Obstetrics and Gynecology

## 2019-02-13 ENCOUNTER — Other Ambulatory Visit: Payer: Self-pay

## 2019-02-13 ENCOUNTER — Ambulatory Visit (INDEPENDENT_AMBULATORY_CARE_PROVIDER_SITE_OTHER): Payer: Medicare Other | Admitting: Obstetrics and Gynecology

## 2019-02-13 VITALS — BP 136/84 | HR 62 | Temp 98.6°F | Resp 14 | Ht 64.5 in | Wt 127.2 lb

## 2019-02-13 DIAGNOSIS — Z01419 Encounter for gynecological examination (general) (routine) without abnormal findings: Secondary | ICD-10-CM | POA: Insufficient documentation

## 2019-02-13 DIAGNOSIS — Z780 Asymptomatic menopausal state: Secondary | ICD-10-CM | POA: Diagnosis not present

## 2019-02-13 NOTE — Patient Instructions (Signed)

## 2019-02-17 LAB — CYTOLOGY - PAP: Diagnosis: NEGATIVE

## 2019-06-20 ENCOUNTER — Ambulatory Visit: Payer: Medicare Other

## 2019-06-28 ENCOUNTER — Ambulatory Visit: Payer: Medicare PPO | Attending: Internal Medicine

## 2019-06-28 DIAGNOSIS — Z23 Encounter for immunization: Secondary | ICD-10-CM | POA: Insufficient documentation

## 2019-06-28 NOTE — Progress Notes (Signed)
   Covid-19 Vaccination Clinic  Name:  ESHAL HOBERMAN    MRN: [id number] DOB: 12/10/1945  06/28/2019  Ms. Villasana was observed post Covid-19 immunization for 15 minutes without incidence. She was provided with Vaccine Information Sheet and instruction to access the V-Safe system.   Ms. Perone was instructed to call 911 with any severe reactions post vaccine: Marland Kitchen Difficulty breathing  . Swelling of your face and throat  . A fast heartbeat  . A bad rash all over your body  . Dizziness and weakness    Immunizations Administered    Name Date Dose VIS Date Route   Pfizer COVID-19 Vaccine 06/28/2019  3:19 PM 0.3 mL 05/02/2019 Intramuscular   Manufacturer: [place]   Lot: [id number]   [place]: [id number]

## 2019-07-11 ENCOUNTER — Ambulatory Visit: Payer: Medicare Other

## 2019-07-23 ENCOUNTER — Ambulatory Visit: Payer: Medicare PPO | Attending: Internal Medicine

## 2019-07-23 DIAGNOSIS — Z23 Encounter for immunization: Secondary | ICD-10-CM

## 2019-07-23 NOTE — Progress Notes (Signed)
   Covid-19 Vaccination Clinic  Name:  Kendra Velazquez    MRN: [id number] DOB: [birth year]  07/23/2019  [name] was observed post Covid-19 immunization for 30 minutes based on pre-vaccination screening without incident. She was provided with Vaccine Information Sheet and instruction to access the V-Safe system.   Ms. Fails was instructed to call 911 with any severe reactions post vaccine: Marland Kitchen Difficulty breathing  . Swelling of face and throat  . A fast heartbeat  . A bad rash all over body  . Dizziness and weakness   Immunizations Administered    Name Date Dose VIS Date Route   Pfizer COVID-19 Vaccine 07/23/2019 11:30 AM 0.3 mL 05/02/2019 Intramuscular   Manufacturer: [place]   Lot: [id number]   [place]: [id number]

## 2019-09-29 ENCOUNTER — Other Ambulatory Visit: Payer: Self-pay | Admitting: Obstetrics and Gynecology

## 2019-09-29 DIAGNOSIS — E2839 Other primary ovarian failure: Secondary | ICD-10-CM

## 2019-10-01 ENCOUNTER — Other Ambulatory Visit: Payer: Medicare PPO

## 2019-10-21 ENCOUNTER — Other Ambulatory Visit: Payer: Self-pay | Admitting: Obstetrics and Gynecology

## 2019-10-21 DIAGNOSIS — Z1231 Encounter for screening mammogram for malignant neoplasm of breast: Secondary | ICD-10-CM

## 2019-12-09 ENCOUNTER — Ambulatory Visit
Admission: RE | Admit: 2019-12-09 | Discharge: 2019-12-09 | Disposition: A | Discharge status: Home / Self Care | Payer: Medicare PPO | Source: Ambulatory Visit | Attending: Obstetrics and Gynecology | Admitting: Obstetrics and Gynecology

## 2019-12-09 ENCOUNTER — Other Ambulatory Visit: Payer: Self-pay

## 2019-12-09 ENCOUNTER — Other Ambulatory Visit: Payer: Self-pay | Admitting: Obstetrics and Gynecology

## 2019-12-09 DIAGNOSIS — E2839 Other primary ovarian failure: Secondary | ICD-10-CM

## 2019-12-09 DIAGNOSIS — Z1231 Encounter for screening mammogram for malignant neoplasm of breast: Secondary | ICD-10-CM

## 2019-12-15 ENCOUNTER — Telehealth: Payer: Self-pay | Admitting: *Deleted

## 2019-12-15 NOTE — Telephone Encounter (Signed)
-----   Message from Nunzio Cobbs, MD sent at 12/13/2019  6:53 AM EDT ----- Please contact patient with results of her bone density testing.   She has osteopenia of the hip, and her spine is normal.  Her FRAX calculation indicates she is at increased risk of fractures, and this has increased further since her last bone density in 2019.   Treatment is indicated to reduce the risk of fracture.  If she would like to consider this, please have her make an appointment with me to review options.

## 2019-12-15 NOTE — Telephone Encounter (Signed)
Burnice Logan, RN  12/15/2019 4:16 PM EDT Back to Top    Left message to call Sharee Pimple, RN at [place].

## 2019-12-18 NOTE — Telephone Encounter (Signed)
Spoke with patient, advised as seen below per Dr. Quincy Simmonds. Patient is scheduled for AEX on 02/17/20, will plan to discuss BMD results at that time. Patient declines earlier appt. Patient verbalizes understanding and is agreeable.  Routing to provider for final review. Patient is agreeable to disposition. Will close encounter.

## 2020-02-16 NOTE — Progress Notes (Signed)
74 y.o. G2P2 Married Caucasian female here for annual exam.    Patient had AEX with PCP 01/2020.  Patient still having problems with cystocele. She has a first to second degree cystocele. She did physical therapy.  The cystocele is uncomfortable and bothersome.  No loss control of urine.  She feels pressure to void and then gets to the bathroom and does not void.  No constipation or loss of control of the stool.   She did not tolerate a pessary due to discomfort.   She declines treatment for her increased risk of fracture.   Likes to work in the yard.   PCP:  Sela Hilding, MD   Patient's last menstrual period was 05/22/2000 (approximate).           Sexually active: No.  The current method of family planning is post menopausal status.    Exercising: Yes.    walking and yoga Smoker:  no  Health Maintenance: Pap: 02-13-19 Neg, 02-07-17 Neg, 01-01-15 Neg History of abnormal Pap:  no MMG: 12-09-19  3D/unilateral Lt./Neg/density B/Birads1 Colonoscopy:  01-08-18 12mm polyp. Next due in 5 years  BMD: 12-09-19 Result :Osteopenia of the NKN:LZJQB normal TDaP: 12-27-12 Gardasil:   no HIV:PCP Hep C:PCP Screening Labs:  PCP.  Flu vaccine:  Completed.  Covid vaccine:  Completed.    reports that she has never smoked. She has never used smokeless tobacco. She reports that she does not drink alcohol and does not use drugs.  Past Medical History:  Diagnosis Date  . Allergy   . Breast cancer ([place])    right DCIS at age 89  . Cystocele with prolapse   . HSV-1 infection   . Hypertension   . Menopause 1998  . Osteopenia 2017   FRAX - increased risk of fracture    Past Surgical History:  Procedure Laterality Date  . AUGMENTATION MAMMAPLASTY Right 09/2008   right breast reconstruction  . BREAST BIOPSY Left 2017  . MASTECTOMY Right 05/2008   DCIS Multicentric--Mastectomy  (neg) nodes---no tamox  . TUBAL LIGATION     BTSP    Current Outpatient Medications  Medication Sig Dispense  Refill  . cetirizine (ZYRTEC) 10 MG tablet Take 10 mg by mouth as needed for allergies.    Marland Kitchen docusate sodium (COLACE) 100 MG capsule Take 100 mg by mouth 2 (two) times daily.    . fluticasone (FLONASE) 50 MCG/ACT nasal spray Place into both nostrils as needed for allergies or rhinitis.    . hydrochlorothiazide (MICROZIDE) 12.5 MG capsule daily.     . meloxicam (MOBIC) 7.5 MG tablet Take 1 tablet by mouth as needed.    . Multiple Vitamin (MULTIVITAMIN) tablet Take 1 tablet by mouth daily.    . ramipril (ALTACE) 10 MG tablet Take 10 mg by mouth daily.    . valACYclovir (VALTREX) 1000 MG tablet Take 1 tablet by mouth as needed. Take 1 tablet prn for cold sores  0  . VITAMIN D PO Take 2,000 Int'l Units by mouth.     No current facility-administered medications for this visit.    Family History  Problem Relation Age of Onset  . Osteoporosis Mother   . Heart disease Father        died of heart attack age 60  . Breast cancer Maternal Grandmother 80  . Breast cancer Cousin 36       maternal 1st cousin  . Osteoporosis Maternal Aunt   . Colon cancer Neg Hx   . Esophageal cancer  Neg Hx   . Rectal cancer Neg Hx   . Stomach cancer Neg Hx     Review of Systems  All other systems reviewed and are negative.   Exam:   BP (!) 146/78   Pulse 64   Resp 14   Ht 5' 4.5" (1.638 m)   Wt 126 lb (57.2 kg)   LMP 05/22/2000 (Approximate)   BMI 21.29 kg/m     General appearance: alert, cooperative and appears stated age Head: normocephalic, without obvious abnormality, atraumatic Neck: no adenopathy, supple, symmetrical, trachea midline and thyroid normal to inspection and palpation Lungs: clear to auscultation bilaterally Breasts: right breast absent with reconstruction present and no masses or axillary adenopathy. Left breast with normal appearance, no masses or tenderness, No nipple retraction or dimpling, No nipple discharge or bleeding, No axillary adenopathy Heart: regular rate and  rhythm Abdomen: soft, non-tender; no masses, no organomegaly Extremities: extremities normal, atraumatic, no cyanosis or edema Skin: skin color, texture, turgor normal. No rashes or lesions Lymph nodes: cervical, supraclavicular, and axillary nodes normal. Neurologic: grossly normal  Pelvic: External genitalia:  no lesions              No abnormal inguinal nodes palpated.              Urethra:  normal appearing urethra with no masses, tenderness or lesions              Bartholins and Skenes: normal                 Vagina: normal appearing vagina with normal color and discharge, no lesions.  Third degree cystocele.  Uterus with first degree prolapse.  Minimal rectocele.               Cervix: no lesions              Pap taken: No. Bimanual Exam:  Uterus:  normal size, contour, position, consistency, mobility, non-tender              Adnexa: no mass, fullness, tenderness              Rectal exam: Yes.  .  Confirms.              Anus:  normal sphincter tone, no lesions  Chaperone was present for exam.  Assessment:   Well woman visit with normal exam. Hx right mastectomy from DCIS.BRCA neg. Incomplete uterovaginal prolapse.  Now has uterine prolapse also. Atrophy may be playing a role in her intolerance of a pessary.  Menopause.  Osteopenia. Increased risk of fracture by FRAX model.  Declines treatment.  HSV 1. Valtrex through PCP.   Plan: Mammogram yearly.  Self breast awareness reviewed. Pap and HR HPV as above. Guidelines for Calcium, Vitamin D, regular exercise program including cardiovascular and weight bearing exercise. We discussed pessary fitting again versus surgical care.  She prefers to try a pessary again.  She will use coconut oil nightly for 2 weeks and then return for the pessary fitting.  Consider BMD in 2023. Follow up annually and prn.   After visit summary provided.

## 2020-02-17 ENCOUNTER — Ambulatory Visit (INDEPENDENT_AMBULATORY_CARE_PROVIDER_SITE_OTHER): Payer: Medicare PPO | Admitting: Obstetrics and Gynecology

## 2020-02-17 ENCOUNTER — Other Ambulatory Visit: Payer: Self-pay

## 2020-02-17 ENCOUNTER — Encounter: Payer: Self-pay | Admitting: Obstetrics and Gynecology

## 2020-02-17 VITALS — BP 146/78 | HR 64 | Resp 14 | Ht 64.5 in | Wt 126.0 lb

## 2020-02-17 DIAGNOSIS — N812 Incomplete uterovaginal prolapse: Secondary | ICD-10-CM | POA: Diagnosis not present

## 2020-02-17 DIAGNOSIS — Z01419 Encounter for gynecological examination (general) (routine) without abnormal findings: Secondary | ICD-10-CM

## 2020-02-17 NOTE — Patient Instructions (Signed)

## 2020-03-04 ENCOUNTER — Other Ambulatory Visit: Payer: Self-pay

## 2020-03-04 ENCOUNTER — Ambulatory Visit: Payer: Medicare PPO | Admitting: Obstetrics and Gynecology

## 2020-03-04 ENCOUNTER — Encounter: Payer: Self-pay | Admitting: Obstetrics and Gynecology

## 2020-03-04 DIAGNOSIS — N812 Incomplete uterovaginal prolapse: Secondary | ICD-10-CM | POA: Diagnosis not present

## 2020-03-04 NOTE — Progress Notes (Signed)
GYNECOLOGY  VISIT   HPI: 74 y.o.   Married  Caucasian  female   G2P2 with Patient's last menstrual period was 05/22/2000 (approximate).   here for pessary fitting for uterovaginal proalpse.  She has a third degree cystocele, first degree uterine prolapse, and minimal rectocele.  Using coconut oil for hydration of the vagina.   We tried a pessary in the past and she could not tolerate it due to pain.   GYNECOLOGIC HISTORY: Patient's last menstrual period was 05/22/2000 (approximate). Contraception:  PMP Menopausal hormone therapy:  None Last mammogram:   12-09-19  3D/unilateral Lt./Neg/density B/Birads1 Last pap smear: 02-13-19 Neg, 02-07-17 Neg, 01-01-15 Neg        OB History    Gravida  2   Para  2   Term      Preterm      AB      Living  2     SAB      TAB      Ectopic      Multiple      Live Births                 Patient Active Problem List   Diagnosis Date Noted  . Family history of brain cancer 10/04/2012  . Breast cancer ([place]) 10/04/2012    Past Medical History:  Diagnosis Date  . Allergy   . Breast cancer ([place])    right DCIS at age 36  . Cystocele with prolapse   . HSV-1 infection   . Hypertension   . Menopause 1998  . Osteopenia 2017   FRAX - increased risk of fracture    Past Surgical History:  Procedure Laterality Date  . AUGMENTATION MAMMAPLASTY Right 09/2008   right breast reconstruction  . BREAST BIOPSY Left 2017  . MASTECTOMY Right 05/2008   DCIS Multicentric--Mastectomy  (neg) nodes---no tamox  . TUBAL LIGATION     BTSP    Current Outpatient Medications  Medication Sig Dispense Refill  . cetirizine (ZYRTEC) 10 MG tablet Take 10 mg by mouth as needed for allergies.    Marland Kitchen docusate sodium (COLACE) 100 MG capsule Take 100 mg by mouth 2 (two) times daily.    . fluticasone (FLONASE) 50 MCG/ACT nasal spray Place into both nostrils as needed for allergies or rhinitis.    . hydrochlorothiazide (MICROZIDE) 12.5 MG capsule daily.     .  meloxicam (MOBIC) 7.5 MG tablet Take 1 tablet by mouth as needed.    . Multiple Vitamin (MULTIVITAMIN) tablet Take 1 tablet by mouth daily.    . ramipril (ALTACE) 10 MG tablet Take 10 mg by mouth daily.    . valACYclovir (VALTREX) 1000 MG tablet Take 1 tablet by mouth as needed. Take 1 tablet prn for cold sores  0  . VITAMIN D PO Take 2,000 Int'l Units by mouth.     No current facility-administered medications for this visit.     ALLERGIES: Tetracyclines & related  Family History  Problem Relation Age of Onset  . Osteoporosis Mother   . Heart disease Father        died of heart attack age 26  . Breast cancer Maternal Grandmother 80  . Breast cancer Cousin 80       maternal 1st cousin  . Osteoporosis Maternal Aunt   . Colon cancer Neg Hx   . Esophageal cancer Neg Hx   . Rectal cancer Neg Hx   . Stomach cancer Neg Hx  Social History   Socioeconomic History  . Marital status: Married    Spouse name: Not on file  . Number of children: Not on file  . Years of education: Not on file  . Highest education level: Not on file  Occupational History  . Occupation: retired    Fish farm manager: [employer]  Tobacco Use  . Smoking status: Never Smoker  . Smokeless tobacco: Never Used  Vaping Use  . Vaping Use: Never used  Substance and Sexual Activity  . Alcohol use: No    Alcohol/week: 0.0 standard drinks  . Drug use: No  . Sexual activity: Not Currently    Partners: Male    Birth control/protection: Post-menopausal, Surgical    Comment: Tubal  Other Topics Concern  . Not on file  Social History Narrative  . Not on file   Social Determinants of Health   Financial Resource Strain:   . Difficulty of Paying Living Expenses: Not on file  Food Insecurity:   . Worried About Charity fundraiser in the Last Year: Not on file  . Ran Out of Food in the Last Year: Not on file  Transportation Needs:   . Lack of Transportation (Medical): Not on file  . Lack of Transportation  (Non-Medical): Not on file  Physical Activity:   . Days of Exercise per Week: Not on file  . Minutes of Exercise per Session: Not on file  Stress:   . Feeling of Stress : Not on file  Social Connections:   . Frequency of Communication with Friends and Family: Not on file  . Frequency of Social Gatherings with Friends and Family: Not on file  . Attends Religious Services: Not on file  . Active Member of Clubs or Organizations: Not on file  . Attends Archivist Meetings: Not on file  . Marital Status: Not on file  Intimate Partner Violence:   . Fear of Current or Ex-Partner: Not on file  . Emotionally Abused: Not on file  . Physically Abused: Not on file  . Sexually Abused: Not on file    Review of Systems  All other systems reviewed and are negative.   PHYSICAL EXAMINATION:    BP (!) 150/78   Pulse 69   Wt 126 lb (57.2 kg)   LMP 05/22/2000 (Approximate)   SpO2 97%   BMI 21.29 kg/m     General appearance: alert, cooperative and appears stated age   Pelvic: External genitalia:  no lesions              Urethra:  normal appearing urethra with no masses, tenderness or lesions              Bartholins and Skenes: normal                 Vagina: normal appearing vagina with normal color and discharge, no lesions              Cervix: no lesions                Bimanual Exam:  Uterus:  normal size, contour, position, consistency, mobility, non-tender              Adnexa: no mass, fullness, tenderness   Pessary fitting: #2 ring with support comfortable and maintained with maneuvers, doesn't feel it. #3 ring with support comfortable and maintained with maneuvers, but aware of it.   Opened new pessaries: #2 Premier ring with support opened and placed in patient  and patient pushed it out with valsalva, which she did not do with the fitting kit pessary. #3 Premier ring with support opened and placed in patient and patient maintained it with valsalva.  Lot number [id number],  expiration 07/19/29.  Chaperone was present for exam.  ASSESSMENT  Incomplete uterovaginal prolapse.  Hx right breast DCIS.   PLAN  #3 ring with support pessary to patient.  Pessary precautions and instructions reviewed with patient.   She will leave the pessary in for one week and then return to learn how to place and remove. Ok to continue with vaginal hydration, water based products and coconut oil.  FU in 1 week

## 2020-03-04 NOTE — Patient Instructions (Signed)

## 2020-03-09 NOTE — Progress Notes (Signed)
GYNECOLOGY  VISIT   HPI: 74 y.o.   Married  Caucasian  female   G2P2 with Patient's last menstrual period was 05/22/2000 (approximate).   here for 1 week pessary follow up. Patient states pessary doing great.  Able to do things that she has has not done for a while.  Leaking urine a little bit.  No bleeding, discharge, or pain.   Daily BMs.  Feels like she needs to work hard to have them.  Using Colace.   GYNECOLOGIC HISTORY: Patient's last menstrual period was 05/22/2000 (approximate). Contraception:  Tubal/PMP Menopausal hormone therapy:  None Last mammogram:  12-09-19  3D/unilateral Lt./Neg/density B/Birads1 Last pap smear:  02-13-19 Neg, 02-07-17 Neg, 01-01-15 Neg        OB History    Gravida  2   Para  2   Term      Preterm      AB      Living  2     SAB      TAB      Ectopic      Multiple      Live Births                 Patient Active Problem List   Diagnosis Date Noted  . Family history of brain cancer 10/04/2012  . Breast cancer ([place]) 10/04/2012    Past Medical History:  Diagnosis Date  . Allergy   . Breast cancer ([place])    right DCIS at age 25  . Cystocele with prolapse   . HSV-1 infection   . Hypertension   . Menopause 1998  . Osteopenia 2017   FRAX - increased risk of fracture    Past Surgical History:  Procedure Laterality Date  . AUGMENTATION MAMMAPLASTY Right 09/2008   right breast reconstruction  . BREAST BIOPSY Left 2017  . MASTECTOMY Right 05/2008   DCIS Multicentric--Mastectomy  (neg) nodes---no tamox  . TUBAL LIGATION     BTSP    Current Outpatient Medications  Medication Sig Dispense Refill  . cetirizine (ZYRTEC) 10 MG tablet Take 10 mg by mouth as needed for allergies.    Marland Kitchen docusate sodium (COLACE) 100 MG capsule Take 100 mg by mouth 2 (two) times daily.    . fluticasone (FLONASE) 50 MCG/ACT nasal spray Place into both nostrils as needed for allergies or rhinitis.    . hydrochlorothiazide (MICROZIDE) 12.5 MG capsule  daily.     . meloxicam (MOBIC) 7.5 MG tablet Take 1 tablet by mouth as needed.    . Multiple Vitamin (MULTIVITAMIN) tablet Take 1 tablet by mouth daily.    . ramipril (ALTACE) 10 MG tablet Take 10 mg by mouth daily.    . valACYclovir (VALTREX) 1000 MG tablet Take 1 tablet by mouth as needed. Take 1 tablet prn for cold sores  0  . VITAMIN D PO Take 2,000 Int'l Units by mouth.     No current facility-administered medications for this visit.     ALLERGIES: Tetracyclines & related  Family History  Problem Relation Age of Onset  . Osteoporosis Mother   . Heart disease Father        died of heart attack age 76  . Breast cancer Maternal Grandmother 80  . Breast cancer Cousin 59       maternal 1st cousin  . Osteoporosis Maternal Aunt   . Colon cancer Neg Hx   . Esophageal cancer Neg Hx   . Rectal cancer Neg Hx   .  Stomach cancer Neg Hx     Social History   Socioeconomic History  . Marital status: Married    Spouse name: Not on file  . Number of children: Not on file  . Years of education: Not on file  . Highest education level: Not on file  Occupational History  . Occupation: retired    Fish farm manager: [employer]  Tobacco Use  . Smoking status: Never Smoker  . Smokeless tobacco: Never Used  Vaping Use  . Vaping Use: Never used  Substance and Sexual Activity  . Alcohol use: No    Alcohol/week: 0.0 standard drinks  . Drug use: No  . Sexual activity: Not Currently    Partners: Male    Birth control/protection: Post-menopausal, Surgical    Comment: Tubal  Other Topics Concern  . Not on file  Social History Narrative  . Not on file   Social Determinants of Health   Financial Resource Strain:   . Difficulty of Paying Living Expenses: Not on file  Food Insecurity:   . Worried About Charity fundraiser in the Last Year: Not on file  . Ran Out of Food in the Last Year: Not on file  Transportation Needs:   . Lack of Transportation (Medical): Not on file  . Lack of  Transportation (Non-Medical): Not on file  Physical Activity:   . Days of Exercise per Week: Not on file  . Minutes of Exercise per Session: Not on file  Stress:   . Feeling of Stress : Not on file  Social Connections:   . Frequency of Communication with Friends and Family: Not on file  . Frequency of Social Gatherings with Friends and Family: Not on file  . Attends Religious Services: Not on file  . Active Member of Clubs or Organizations: Not on file  . Attends Archivist Meetings: Not on file  . Marital Status: Not on file  Intimate Partner Violence:   . Fear of Current or Ex-Partner: Not on file  . Emotionally Abused: Not on file  . Physically Abused: Not on file  . Sexually Abused: Not on file    Review of Systems  All other systems reviewed and are negative.   PHYSICAL EXAMINATION:    BP 140/82   Pulse 70   Ht 5' 4.5" (1.638 m)   Wt 126 lb (57.2 kg)   LMP 05/22/2000 (Approximate)   BMI 21.29 kg/m     General appearance: alert, cooperative and appears stated age   Pelvic: External genitalia:  no lesions              Urethra:  normal appearing urethra with no masses, tenderness or lesions              Bartholins and Skenes: normal                 Vagina: normal appearing vagina with normal color and discharge, no lesions.  Minimal erythema of the posterior vaginal cuff.  Mild atrophy noted.               Cervix: no lesions                Bimanual Exam:  Uterus:  normal size, contour, position, consistency, mobility, non-tender              Adnexa: no mass, fullness, tenderness           Pessary removed and cleansed.  Patient able to place and remove  without difficulty.  Chaperone was present for exam.  ASSESSMENT  Incomplete uterovaginal prolapse.  Pessary maintenance.  Constipation symptoms.   PLAN  Pessary care reviewed with patient.  Add fiber, Metamucil, or Miralax. Fu for annual exam and prn.

## 2020-03-11 ENCOUNTER — Ambulatory Visit: Payer: Medicare PPO | Admitting: Obstetrics and Gynecology

## 2020-03-11 ENCOUNTER — Other Ambulatory Visit: Payer: Self-pay

## 2020-03-11 ENCOUNTER — Encounter: Payer: Self-pay | Admitting: Obstetrics and Gynecology

## 2020-03-11 VITALS — BP 140/82 | HR 70 | Ht 64.5 in | Wt 126.0 lb

## 2020-03-11 DIAGNOSIS — Z4689 Encounter for fitting and adjustment of other specified devices: Secondary | ICD-10-CM

## 2020-03-11 DIAGNOSIS — N812 Incomplete uterovaginal prolapse: Secondary | ICD-10-CM | POA: Diagnosis not present

## 2020-03-11 DIAGNOSIS — K5900 Constipation, unspecified: Secondary | ICD-10-CM | POA: Diagnosis not present

## 2020-03-17 ENCOUNTER — Ambulatory Visit (INDEPENDENT_AMBULATORY_CARE_PROVIDER_SITE_OTHER): Payer: Medicare PPO

## 2020-03-17 ENCOUNTER — Other Ambulatory Visit: Payer: Self-pay

## 2020-03-17 ENCOUNTER — Encounter (HOSPITAL_COMMUNITY): Payer: Self-pay

## 2020-03-17 ENCOUNTER — Ambulatory Visit (HOSPITAL_COMMUNITY)
Admission: EM | Admit: 2020-03-17 | Discharge: 2020-03-17 | Disposition: A | Discharge status: Home / Self Care | Payer: Medicare PPO

## 2020-03-17 ENCOUNTER — Ambulatory Visit (HOSPITAL_COMMUNITY)
Admission: EM | Admit: 2020-03-17 | Discharge: 2020-03-17 | Disposition: A | Discharge status: Home / Self Care | Payer: Medicare PPO | Attending: Family Medicine | Admitting: Family Medicine

## 2020-03-17 DIAGNOSIS — W19XXXA Unspecified fall, initial encounter: Secondary | ICD-10-CM

## 2020-03-17 DIAGNOSIS — M79642 Pain in left hand: Secondary | ICD-10-CM

## 2020-03-17 DIAGNOSIS — S62307A Unspecified fracture of fifth metacarpal bone, left hand, initial encounter for closed fracture: Secondary | ICD-10-CM | POA: Diagnosis not present

## 2020-03-17 NOTE — Discharge Instructions (Addendum)
[facility] - Dr. Apolonio Schneiders [phone]

## 2020-03-17 NOTE — Progress Notes (Signed)
Orthopedic Tech Progress Note Patient Details:  Kendra Velazquez 02/20/1946 [id number]  Ortho Devices Type of Ortho Device: Ulna gutter splint Ortho Device/Splint Location: LUE Ortho Device/Splint Interventions: Ordered, Application, Adjustment   Post Interventions Patient Tolerated: Well Instructions Provided: Care of device, Adjustment of device, Poper ambulation with device   [name] 03/17/2020, 1:09 PM

## 2020-03-17 NOTE — ED Triage Notes (Signed)
Pt presents with left hand & wrist injury after a fall this morning while walking her dog.

## 2020-03-17 NOTE — ED Provider Notes (Addendum)
[facility]    CSN: [id number] Arrival date & time: 03/17/20  1047      History   Chief Complaint Chief Complaint  Patient presents with  . Hand Injury  . Wrist Pain    HPI Kendra Velazquez is a 74 y.o. female.   Patient presenting today with left hand pain, bruising and swelling after falling onto the hand when her dog lunged forward on leash this morning. States pain is about 6/10. Took some OTC pain relievers and has been icing the hand. Able to move all 5 fingers. Some numbness in last 3 fingers.      Past Medical History:  Diagnosis Date  . Allergy   . 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 Cystocele with prolapse   . HSV-1 infection   . Hypertension   . Menopause 1998  . Osteopenia 2017   FRAX - increased risk of fracture    Patient Active Problem List   Diagnosis Date Noted  . Family history of brain cancer 10/04/2012  . Breast cancer ([place]) 10/04/2012    Past Surgical History:  Procedure Laterality Date  . AUGMENTATION MAMMAPLASTY Right 09/2008   right breast reconstruction  . BREAST BIOPSY Left 2017  . MASTECTOMY Right 05/2008   DCIS Multicentric--Mastectomy  (neg) nodes---no tamox  . TUBAL LIGATION     BTSP    OB History    Gravida  2   Para  2   Term      Preterm      AB      Living  2     SAB      TAB      Ectopic      Multiple      Live Births               Home Medications    Prior to Admission medications   Medication Sig Start Date End Date Taking? Authorizing Provider  cetirizine (ZYRTEC) 10 MG tablet Take 10 mg by mouth as needed for allergies.    [provider]  docusate sodium (COLACE) 100 MG capsule Take 100 mg by mouth 2 (two) times daily.    [provider]  fluticasone (FLONASE) 50 MCG/ACT nasal spray Place into both nostrils as needed for allergies or rhinitis.    [provider]  hydrochlorothiazide (MICROZIDE) 12.5 MG capsule daily.  11/08/12    [provider]  meloxicam (MOBIC) 7.5 MG tablet Take 1 tablet by mouth as needed. 12/19/19   [provider]  Multiple Vitamin (MULTIVITAMIN) tablet Take 1 tablet by mouth daily.    [provider]  ramipril (ALTACE) 10 MG tablet Take 10 mg by mouth daily.    [provider]  valACYclovir (VALTREX) 1000 MG tablet Take 1 tablet by mouth as needed. Take 1 tablet prn for cold sores 12/11/14   [provider]  VITAMIN D PO Take 2,000 Int'l Units by mouth.    [provider]    Family History Family History  Problem Relation Age of Onset  . Osteoporosis Mother   . Heart disease Father        died of heart attack age 6  . Breast cancer Maternal Grandmother 80  . Breast cancer Cousin 38       maternal 1st cousin  . Osteoporosis Maternal Aunt   . Colon cancer Neg Hx   . Esophageal cancer Neg Hx   . Rectal  cancer Neg Hx   . Stomach cancer Neg Hx     Social History Social History   Tobacco Use  . Smoking status: Never Smoker  . Smokeless tobacco: Never Used  Vaping Use  . Vaping Use: Never used  Substance Use Topics  . Alcohol use: No    Alcohol/week: 0.0 standard drinks  . Drug use: No     Allergies   Tetracyclines & related   Review of Systems Review of Systems PER HPI    Physical Exam Triage Vital Signs ED Triage Vitals  Enc Vitals Group     BP 03/17/20 1141 (!) 171/70     Pulse Rate 03/17/20 1141 60     Resp 03/17/20 1141 18     Temp 03/17/20 1141 98.8 F (37.1 C)     Temp Source 03/17/20 1141 Oral     SpO2 03/17/20 1141 98 %     Weight --      Height --      Head Circumference --      Peak Flow --      Pain Score 03/17/20 1144 6     Pain Loc --      Pain Edu? --      Excl. in [place]? --    No data found.  Updated Vital Signs BP (!) 171/70 (BP Location: Right Arm)   Pulse 60   Temp 98.8 F (37.1 C) (Oral)   Resp 18   LMP 05/22/2000 (Approximate)   SpO2 98%   Visual Acuity Right Eye Distance:     Left Eye Distance:   Bilateral Distance:    Right Eye Near:   Left Eye Near:    Bilateral Near:     Physical Exam Vitals and nursing note reviewed.  Constitutional:      Appearance: Normal appearance. She is not ill-appearing.  HENT:     Head: Atraumatic.  Eyes:     Extraocular Movements: Extraocular movements intact.     Conjunctiva/sclera: Conjunctivae normal.  Cardiovascular:     Rate and Rhythm: Normal rate and regular rhythm.     Pulses: Normal pulses.     Heart sounds: Normal heart sounds.  Pulmonary:     Effort: Pulmonary effort is normal.     Breath sounds: Normal breath sounds.  Musculoskeletal:     Cervical back: Normal range of motion and neck supple.     Comments: Able to move all 5 fingers left hand, though swelling inhibiting some movement in last 2 fingers  Skin:    General: Skin is warm and dry.     Comments: No skin abrasions noted on exam Bruising and localized swelling to 5th carpal and metacarpal left hand.  Wedding ring and band still in place left 4th digit with significant swelling impeding removal  Neurological:     Mental Status: She is alert and oriented to person, place, and time.     Comments: B/l UEs neurovascularly intact  Psychiatric:        Mood and Affect: Mood normal.        Thought Content: Thought content normal.        Judgment: Judgment normal.      UC Treatments / Results  Labs (all labs ordered are listed, but only abnormal results are displayed) Labs Reviewed - No data to display  EKG   Radiology DG Wrist Complete Left  Result Date: 03/17/2020 CLINICAL DATA:  Left hand pain after fall today. EXAM: LEFT WRIST - COMPLETE 3+  VIEW COMPARISON:  None. FINDINGS: There is no evidence of fracture or dislocation. There is no evidence of arthropathy or other focal bone abnormality. Soft tissues are unremarkable. IMPRESSION: Negative. Electronically Signed   By: Marijo Conception M.D.   On: 03/17/2020 12:13   DG Hand Complete  Left  Result Date: 03/17/2020 CLINICAL DATA:  Left hand pain after fall. EXAM: LEFT HAND - COMPLETE 3+ VIEW COMPARISON:  None. FINDINGS: Mildly displaced fracture is seen involving the distal fifth metacarpal. No other bony abnormality is noted. Joint spaces are intact. IMPRESSION: Mildly displaced distal fifth metacarpal fracture. Electronically Signed   By: Marijo Conception M.D.   On: 03/17/2020 12:15    Procedures Procedures (including critical care time)  Medications Ordered in UC Medications - No data to display  Initial Impression / Assessment and Plan / UC Course  I have reviewed the triage vital signs and the nursing notes.  Pertinent labs & imaging results that were available during my care of the patient were reviewed by me and considered in my medical decision making (see chart for details).     X-ray showing 5th distal metacarpal fracture of left hand with mild displacement. Ring unable to be removed so ring cutter used to remove her wedding ring. Ulnar gutter splint placed today, information given for Hand Specialist and discussed home care and to make an appt with them asap.   Final Clinical Impressions(s) / UC Diagnoses   Final diagnoses:  Closed displaced fracture of fifth metacarpal bone of left hand, unspecified portion of metacarpal, initial encounter     Discharge Instructions     [facility] - Dr. Apolonio Schneiders [phone]    ED Prescriptions    None     PDMP not reviewed this encounter.   Volney American, [place] 03/17/20 2129    Volney American, [place] 03/17/20 2130

## 2020-03-19 DIAGNOSIS — M79642 Pain in left hand: Secondary | ICD-10-CM | POA: Diagnosis not present

## 2020-03-19 DIAGNOSIS — S62337A Displaced fracture of neck of fifth metacarpal bone, left hand, initial encounter for closed fracture: Secondary | ICD-10-CM | POA: Diagnosis not present

## 2020-03-19 DIAGNOSIS — S62307A Unspecified fracture of fifth metacarpal bone, left hand, initial encounter for closed fracture: Secondary | ICD-10-CM | POA: Diagnosis not present

## 2020-03-30 DIAGNOSIS — S62337A Displaced fracture of neck of fifth metacarpal bone, left hand, initial encounter for closed fracture: Secondary | ICD-10-CM | POA: Diagnosis not present

## 2020-04-01 DIAGNOSIS — D225 Melanocytic nevi of trunk: Secondary | ICD-10-CM | POA: Diagnosis not present

## 2020-04-01 DIAGNOSIS — C44311 Basal cell carcinoma of skin of nose: Secondary | ICD-10-CM | POA: Diagnosis not present

## 2020-04-01 DIAGNOSIS — L814 Other melanin hyperpigmentation: Secondary | ICD-10-CM | POA: Diagnosis not present

## 2020-04-01 DIAGNOSIS — L570 Actinic keratosis: Secondary | ICD-10-CM | POA: Diagnosis not present

## 2020-04-01 DIAGNOSIS — L821 Other seborrheic keratosis: Secondary | ICD-10-CM | POA: Diagnosis not present

## 2020-04-01 DIAGNOSIS — L218 Other seborrheic dermatitis: Secondary | ICD-10-CM | POA: Diagnosis not present

## 2020-04-01 DIAGNOSIS — D485 Neoplasm of uncertain behavior of skin: Secondary | ICD-10-CM | POA: Diagnosis not present

## 2020-04-01 DIAGNOSIS — D1801 Hemangioma of skin and subcutaneous tissue: Secondary | ICD-10-CM | POA: Diagnosis not present

## 2020-04-01 DIAGNOSIS — L718 Other rosacea: Secondary | ICD-10-CM | POA: Diagnosis not present

## 2020-04-13 DIAGNOSIS — S62337A Displaced fracture of neck of fifth metacarpal bone, left hand, initial encounter for closed fracture: Secondary | ICD-10-CM | POA: Diagnosis not present

## 2020-05-05 DIAGNOSIS — H6121 Impacted cerumen, right ear: Secondary | ICD-10-CM | POA: Diagnosis not present

## 2020-05-11 DIAGNOSIS — S62337A Displaced fracture of neck of fifth metacarpal bone, left hand, initial encounter for closed fracture: Secondary | ICD-10-CM | POA: Diagnosis not present

## 2020-05-22 HISTORY — PX: MOHS SURGERY: SUR867

## 2020-05-26 DIAGNOSIS — C44311 Basal cell carcinoma of skin of nose: Secondary | ICD-10-CM | POA: Diagnosis not present

## 2020-10-06 DIAGNOSIS — A6920 Lyme disease, unspecified: Secondary | ICD-10-CM | POA: Diagnosis not present

## 2020-11-03 ENCOUNTER — Other Ambulatory Visit: Payer: Self-pay | Admitting: Family Medicine

## 2020-11-03 DIAGNOSIS — Z1231 Encounter for screening mammogram for malignant neoplasm of breast: Secondary | ICD-10-CM

## 2020-12-07 DIAGNOSIS — L570 Actinic keratosis: Secondary | ICD-10-CM | POA: Diagnosis not present

## 2020-12-28 ENCOUNTER — Other Ambulatory Visit: Payer: Self-pay

## 2020-12-28 ENCOUNTER — Ambulatory Visit
Admission: RE | Admit: 2020-12-28 | Discharge: 2020-12-28 | Disposition: A | Discharge status: Home / Self Care | Payer: Medicare PPO | Source: Ambulatory Visit | Attending: Family Medicine | Admitting: Family Medicine

## 2020-12-28 DIAGNOSIS — Z1231 Encounter for screening mammogram for malignant neoplasm of breast: Secondary | ICD-10-CM | POA: Diagnosis not present

## 2020-12-30 ENCOUNTER — Emergency Department (HOSPITAL_BASED_OUTPATIENT_CLINIC_OR_DEPARTMENT_OTHER)
Admission: EM | Admit: 2020-12-30 | Discharge: 2020-12-30 | Disposition: A | Discharge status: Home / Self Care | Payer: Medicare PPO | Attending: Emergency Medicine | Admitting: Emergency Medicine

## 2020-12-30 ENCOUNTER — Emergency Department (HOSPITAL_BASED_OUTPATIENT_CLINIC_OR_DEPARTMENT_OTHER): Payer: Medicare PPO | Admitting: Radiology

## 2020-12-30 ENCOUNTER — Encounter (HOSPITAL_BASED_OUTPATIENT_CLINIC_OR_DEPARTMENT_OTHER): Payer: Self-pay | Admitting: Emergency Medicine

## 2020-12-30 ENCOUNTER — Other Ambulatory Visit: Payer: Self-pay

## 2020-12-30 DIAGNOSIS — I10 Essential (primary) hypertension: Secondary | ICD-10-CM | POA: Diagnosis not present

## 2020-12-30 DIAGNOSIS — Z853 Personal history of malignant neoplasm of breast: Secondary | ICD-10-CM | POA: Diagnosis not present

## 2020-12-30 DIAGNOSIS — W540XXA Bitten by dog, initial encounter: Secondary | ICD-10-CM | POA: Insufficient documentation

## 2020-12-30 DIAGNOSIS — S61452A Open bite of left hand, initial encounter: Secondary | ICD-10-CM | POA: Diagnosis not present

## 2020-12-30 DIAGNOSIS — Z79899 Other long term (current) drug therapy: Secondary | ICD-10-CM | POA: Insufficient documentation

## 2020-12-30 DIAGNOSIS — S81812A Laceration without foreign body, left lower leg, initial encounter: Secondary | ICD-10-CM | POA: Diagnosis not present

## 2020-12-30 DIAGNOSIS — S8992XA Unspecified injury of left lower leg, initial encounter: Secondary | ICD-10-CM | POA: Diagnosis present

## 2020-12-30 MED ORDER — IBUPROFEN 400 MG PO TABS
600.0000 mg | ORAL_TABLET | Freq: Once | ORAL | Status: AC
  Administered 2020-12-30: 600 mg via ORAL
  Filled 2020-12-30: qty 1

## 2020-12-30 MED ORDER — TETANUS-DIPHTH-ACELL PERTUSSIS 5-2.5-18.5 LF-MCG/0.5 IM SUSY
0.5000 mL | PREFILLED_SYRINGE | Freq: Once | INTRAMUSCULAR | Status: DC
  Filled 2020-12-30: qty 0.5

## 2020-12-30 MED ORDER — AMOXICILLIN-POT CLAVULANATE 875-125 MG PO TABS: 1.0000 | ORAL_TABLET | Freq: Two times a day (BID) | ORAL | 0 refills | Status: DC

## 2020-12-30 MED ORDER — LIDOCAINE-EPINEPHRINE (PF) 2 %-1:200000 IJ SOLN
20.0000 mL | Freq: Once | INTRAMUSCULAR | Status: DC
  Filled 2020-12-30: qty 20

## 2020-12-30 MED ORDER — BACITRACIN ZINC 500 UNIT/GM EX OINT: TOPICAL_OINTMENT | Freq: Once | CUTANEOUS | Status: DC

## 2020-12-30 MED ORDER — LIDOCAINE-EPINEPHRINE-TETRACAINE (LET) TOPICAL GEL
3.0000 mL | Freq: Once | TOPICAL | Status: AC
  Administered 2020-12-30: 3 mL via TOPICAL
  Filled 2020-12-30: qty 3

## 2020-12-30 NOTE — ED Provider Notes (Signed)
[facility] Provider Note   CSN: [id number] Arrival date & time: 12/30/20  [id number]     History Chief Complaint  Patient presents with   Animal Bite    Kendra Velazquez is a 75 y.o. female.  HPI 75 year old female presents with left leg injury after a dog bite.  This occurred this morning and she came straight here.  She was walking and saw a man that she usually sees with his dog and he was having a hard time picking up the feces.  She went to help him and the dog bit her in the leg.  The man stated that the dog shots are up-to-date.  She is unsure of her last tetanus was in the last 10 years.  There is moderate to severe pain in the leg.  Past Medical History:  Diagnosis Date   Allergy    Breast cancer ([place])    right DCIS at age 8   Cystocele with prolapse    HSV-1 infection    Hypertension    Menopause 1998   Osteopenia 2017   FRAX - increased risk of fracture    Patient Active Problem List   Diagnosis Date Noted   Family history of brain cancer 10/04/2012   Breast cancer ([place]) 10/04/2012    Past Surgical History:  Procedure Laterality Date   AUGMENTATION MAMMAPLASTY Right 09/2008   right breast reconstruction   BREAST BIOPSY Left 2017   MASTECTOMY Right 05/2008   DCIS Multicentric--Mastectomy  (neg) nodes---no tamox   TUBAL LIGATION     BTSP     OB History     Gravida  2   Para  2   Term      Preterm      AB      Living  2      SAB      IAB      Ectopic      Multiple      Live Births              Family History  Problem Relation Age of Onset   Osteoporosis Mother    Heart disease Father        died of heart attack age 47   Breast cancer Maternal Grandmother 22   Breast cancer Cousin 90       maternal 1st cousin   Osteoporosis Maternal Aunt    Colon cancer Neg Hx    Esophageal cancer Neg Hx    Rectal cancer Neg Hx    Stomach cancer Neg Hx     Social History   Tobacco Use   Smoking  status: Never   Smokeless tobacco: Never  Vaping Use   Vaping Use: Never used  Substance Use Topics   Alcohol use: No    Alcohol/week: 0.0 standard drinks   Drug use: No    Home Medications Prior to Admission medications   Medication Sig Start Date End Date Taking? Authorizing Provider  amoxicillin-clavulanate (AUGMENTIN) 875-125 MG tablet Take 1 tablet by mouth 2 (two) times daily. 12/30/20  Yes Sherwood Gambler, MD  cetirizine (ZYRTEC) 10 MG tablet Take 10 mg by mouth as needed for allergies.    [provider]  docusate sodium (COLACE) 100 MG capsule Take 100 mg by mouth 2 (two) times daily.    [provider]  fluticasone (FLONASE) 50 MCG/ACT nasal spray Place into both nostrils as needed for allergies or rhinitis.    [provider]  hydrochlorothiazide (MICROZIDE) 12.5 MG capsule daily.  11/08/12   [provider]  meloxicam (MOBIC) 7.5 MG tablet Take 1 tablet by mouth as needed. 12/19/19   [provider]  Multiple Vitamin (MULTIVITAMIN) tablet Take 1 tablet by mouth daily.    [provider]  ramipril (ALTACE) 10 MG tablet Take 10 mg by mouth daily.    [provider]  valACYclovir (VALTREX) 1000 MG tablet Take 1 tablet by mouth as needed. Take 1 tablet prn for cold sores 12/11/14   [provider]  VITAMIN D PO Take 2,000 Int'l Units by mouth.    [provider]    Allergies    Tetracyclines & related  Review of Systems   Review of Systems  Musculoskeletal:  Positive for arthralgias.  Skin:  Positive for wound.   Physical Exam Updated Vital Signs BP (!) 152/74   Pulse 61   Temp 98.2 F (36.8 C) (Oral)   Resp 16   Ht '5\' 5"'$  (1.651 m)   Wt 58.1 kg   LMP 05/22/2000 (Approximate)   SpO2 100%   BMI 21.30 kg/m   Physical Exam Vitals and nursing note reviewed.  Constitutional:      Appearance: She is well-developed.  HENT:     Head: Normocephalic and atraumatic.     Right Ear: External  ear normal.     Left Ear: External ear normal.     Nose: Nose normal.  Eyes:     General:        Right eye: No discharge.        Left eye: No discharge.  Cardiovascular:     Rate and Rhythm: Normal rate and regular rhythm.     Pulses:          Dorsalis pedis pulses are 2+ on the left side.  Pulmonary:     Effort: Pulmonary effort is normal.  Abdominal:     General: There is no distension.  Musculoskeletal:     Left lower leg: Laceration and tenderness present.       Legs:  Skin:    General: Skin is warm and dry.  Neurological:     Mental Status: She is alert.  Psychiatric:        Mood and Affect: Mood is not anxious.    ED Results / Procedures / Treatments   Labs (all labs ordered are listed, but only abnormal results are displayed) Labs Reviewed - No data to display  EKG None  Radiology DG Tibia/Fibula Left  Result Date: 12/30/2020 CLINICAL DATA:  Dog bite EXAM: LEFT TIBIA AND FIBULA - 2 VIEW COMPARISON:  None. FINDINGS: There is no acute fracture or dislocation. Knee alignment is normal. Ankle alignment is normal on the AP. There is no soft tissue gas. There is no radiopaque foreign body. IMPRESSION: Normal tibia/fibula radiographs. Electronically Signed   By: Valetta Mole MD   On: 12/30/2020 09:13    Procedures .Marland KitchenLaceration Repair  Date/Time: 12/30/2020 9:49 AM Performed by: Sherwood Gambler, MD Authorized by: Sherwood Gambler, MD   Consent:    Consent obtained:  Verbal   Consent given by:  Patient Anesthesia:    Anesthesia method:  Local infiltration and topical application   Topical anesthetic:  LET   Local anesthetic:  Lidocaine 2% WITH epi Laceration details:    Location:  Leg   Leg location:  L lower leg   Length (cm):  4 Pre-procedure details:    Preparation:  Patient was prepped and draped  in usual sterile fashion and imaging obtained to evaluate for foreign bodies Exploration:    Hemostasis achieved with:  Direct pressure   Imaging obtained: x-ray      Imaging outcome: foreign body not noted     Contaminated: no   Treatment:    Area cleansed with:  Saline   Amount of cleaning:  Standard   Irrigation solution:  Sterile saline   Irrigation method:  Syringe Skin repair:    Repair method:  Sutures   Suture size:  4-0   Suture material:  Nylon   Suture technique:  Simple interrupted   Number of sutures:  4 Approximation:    Approximation:  Close Repair type:    Repair type:  Simple Post-procedure details:    Dressing:  Antibiotic ointment   Procedure completion:  Tolerated well, no immediate complications   Medications Ordered in ED Medications  lidocaine-EPINEPHrine (XYLOCAINE W/EPI) 2 %-1:200000 (PF) injection 20 mL (has no administration in time range)  Tdap (BOOSTRIX) injection 0.5 mL (has no administration in time range)  bacitracin ointment (has no administration in time range)  lidocaine-EPINEPHrine-tetracaine (LET) topical gel (3 mLs Topical Given 12/30/20 0842)  ibuprofen (ADVIL) tablet 600 mg (600 mg Oral Given 12/30/20 [id number])    ED Course  I have reviewed the triage vital signs and the nursing notes.  Pertinent labs & imaging results that were available during my care of the patient were reviewed by me and considered in my medical decision making (see chart for details).    MDM Rules/Calculators/A&P                           Patient has full range of motion of her ankle/leg and so I highly doubt a deeper injury like a tendon injury or muscle injury.  Neurovascular intact.  No obvious foreign bodies or bony fractures on x-ray.  Wound was cleaned with saline and then sutures were applied as above.  We will give antibiotics.  Ibuprofen/Tylenol for pain.  She was able to look in her my chart and see that she had a tetanus shot about 8 years ago and is okay holding off on 1 now.  She feels confident that the dog does not have rabies and will follow up with the neighbor. We discussed risk of infection and need to return  if developing any signs Final Clinical Impression(s) / ED Diagnoses Final diagnoses:  Laceration of left lower extremity, initial encounter  Dog bite, initial encounter    Rx / DC Orders ED Discharge Orders          Ordered    amoxicillin-clavulanate (AUGMENTIN) 875-125 MG tablet  2 times daily        12/30/20 0947             Sherwood Gambler, MD 12/30/20 [id number]

## 2020-12-30 NOTE — ED Triage Notes (Signed)
Pt arrives to ED with c/o of animal bite to left lower leg. Pt reports it was a dog bite. The dog owner reports the dog was up to date on his vaccinations.

## 2021-01-04 DIAGNOSIS — W540XXD Bitten by dog, subsequent encounter: Secondary | ICD-10-CM | POA: Diagnosis not present

## 2021-01-04 DIAGNOSIS — S81852D Open bite, left lower leg, subsequent encounter: Secondary | ICD-10-CM | POA: Diagnosis not present

## 2021-01-14 DIAGNOSIS — D485 Neoplasm of uncertain behavior of skin: Secondary | ICD-10-CM | POA: Diagnosis not present

## 2021-01-14 DIAGNOSIS — L570 Actinic keratosis: Secondary | ICD-10-CM | POA: Diagnosis not present

## 2021-02-18 DIAGNOSIS — Z23 Encounter for immunization: Secondary | ICD-10-CM | POA: Diagnosis not present

## 2021-02-18 DIAGNOSIS — Z0000 Encounter for general adult medical examination without abnormal findings: Secondary | ICD-10-CM | POA: Diagnosis not present

## 2021-02-18 DIAGNOSIS — E7800 Pure hypercholesterolemia, unspecified: Secondary | ICD-10-CM | POA: Diagnosis not present

## 2021-02-18 DIAGNOSIS — E559 Vitamin D deficiency, unspecified: Secondary | ICD-10-CM | POA: Diagnosis not present

## 2021-02-18 DIAGNOSIS — I10 Essential (primary) hypertension: Secondary | ICD-10-CM | POA: Diagnosis not present

## 2021-02-21 ENCOUNTER — Other Ambulatory Visit (HOSPITAL_COMMUNITY)
Admission: RE | Admit: 2021-02-21 | Discharge: 2021-02-21 | Disposition: A | Discharge status: Home / Self Care | Payer: Medicare PPO | Source: Ambulatory Visit | Attending: Obstetrics and Gynecology | Admitting: Obstetrics and Gynecology

## 2021-02-21 ENCOUNTER — Ambulatory Visit (INDEPENDENT_AMBULATORY_CARE_PROVIDER_SITE_OTHER): Payer: Medicare PPO | Admitting: Obstetrics and Gynecology

## 2021-02-21 ENCOUNTER — Other Ambulatory Visit: Payer: Self-pay

## 2021-02-21 ENCOUNTER — Encounter: Payer: Self-pay | Admitting: Obstetrics and Gynecology

## 2021-02-21 VITALS — BP 132/74 | HR 65 | Ht 65.0 in | Wt 130.8 lb

## 2021-02-21 DIAGNOSIS — Z01419 Encounter for gynecological examination (general) (routine) without abnormal findings: Secondary | ICD-10-CM

## 2021-02-21 DIAGNOSIS — Z4689 Encounter for fitting and adjustment of other specified devices: Secondary | ICD-10-CM

## 2021-02-21 DIAGNOSIS — N812 Incomplete uterovaginal prolapse: Secondary | ICD-10-CM | POA: Diagnosis not present

## 2021-02-21 DIAGNOSIS — Z124 Encounter for screening for malignant neoplasm of cervix: Secondary | ICD-10-CM

## 2021-02-21 NOTE — Patient Instructions (Signed)

## 2021-02-21 NOTE — Progress Notes (Signed)
75 y.o. G2P2 Married Caucasian female here for breast and pelvic exam.    Patient has pessary in. She has incomplete uterovaginal prolapse.  The pessary is comfortable.  Using Astroglide.  No vaginal bleeding.   Bladder control is good.  Uses a pad only if she is leaving the house. No problems with bowel function.   Labs just done last week.  PCP: Sela Hilding, MD  Patient's last menstrual period was 05/22/2000 (approximate).           Sexually active: No.  The current method of family planning is post menopausal status.    Exercising: Yes.     Walks 5-6 miles daily, yoga Smoker:  no  Health Maintenance: Pap: 02-13-19 Neg, 02-07-17 Neg, 01-01-15 Neg History of abnormal Pap:  no MMG: 12-28-20 3D Lt.Br./Neg/BiRads1 Colonoscopy: 01-08-18 54m polyp. Next due in 5 years  BMD:  12-09-19  Result :Osteopenia of hip Flu vaccine:  completed.   reports that she has never smoked. She has never used smokeless tobacco. She reports that she does not drink alcohol and does not use drugs.  Past Medical History:  Diagnosis Date   Allergy    Breast cancer ([place]    right DCIS at age 75  Cystocele with prolapse    HSV-1 infection    Hypertension    Menopause 1998   Osteopenia 2017   FRAX - increased risk of fracture    Past Surgical History:  Procedure Laterality Date   AUGMENTATION MAMMAPLASTY Right 09/2008   right breast reconstruction   BREAST BIOPSY Left 2017   MASTECTOMY Right 05/2008   DCIS Multicentric--Mastectomy  (neg) nodes---no tamox   MOHS SURGERY  05/22/2020   on nose for basal cell   TUBAL LIGATION     BTSP    Current Outpatient Medications  Medication Sig Dispense Refill   cetirizine (ZYRTEC) 10 MG tablet Take 10 mg by mouth as needed for allergies.     docusate sodium (COLACE) 100 MG capsule Take 100 mg by mouth 2 (two) times daily.     fluticasone (FLONASE) 50 MCG/ACT nasal spray Place into both nostrils as needed for allergies or rhinitis.      hydrochlorothiazide (MICROZIDE) 12.5 MG capsule daily.      Multiple Vitamin (MULTIVITAMIN) tablet Take 1 tablet by mouth daily.     ramipril (ALTACE) 10 MG tablet Take 10 mg by mouth daily.     valACYclovir (VALTREX) 1000 MG tablet Take 1 tablet by mouth as needed. Take 1 tablet prn for cold sores  0   VITAMIN D PO Take 2,000 Int'l Units by mouth.     No current facility-administered medications for this visit.    Family History  Problem Relation Age of Onset   Osteoporosis Mother    Heart disease Father        died of heart attack age 74  Breast cancer Maternal Grandmother 866  Breast cancer Cousin 667      maternal 1st cousin   Osteoporosis Maternal Aunt    Colon cancer Neg Hx    Esophageal cancer Neg Hx    Rectal cancer Neg Hx    Stomach cancer Neg Hx     Review of Systems  All other systems reviewed and are negative.  Exam:   BP 132/74   Pulse 65   Ht 5' 5"  (1.651 m)   Wt 130 lb 12.8 oz (59.3 kg)   LMP 05/22/2000 (Approximate)   SpO2 98%  BMI 21.77 kg/m     General appearance: alert, cooperative and appears stated age Head: normocephalic, without obvious abnormality, atraumatic Neck: no adenopathy, supple, symmetrical, trachea midline and thyroid normal to inspection and palpation Lungs: clear to auscultation bilaterally Breasts: right breast absent and implant present.  No axillary adenopathy. Left:  normal appearance, no masses or tenderness, No nipple retraction or dimpling, No nipple discharge or bleeding, No axillary adenopathy Heart: regular rate and rhythm Abdomen: soft, non-tender; no masses, no organomegaly Extremities: extremities normal, atraumatic, no cyanosis or edema Skin: skin color, texture, turgor normal. No rashes or lesions Lymph nodes: cervical, supraclavicular, and axillary nodes normal. Neurologic: grossly normal  Pelvic: External genitalia:  no lesions              No abnormal inguinal nodes palpated.              Urethra:  normal  appearing urethra with no masses, tenderness or lesions              Bartholins and Skenes: normal                 Vagina: normal appearing vagina with normal color and discharge, no lesions              Cervix: no lesions              Pap taken: yes Bimanual Exam:  Uterus:  normal size, contour, position, consistency, mobility, non-tender              Adnexa: no mass, fullness, tenderness              Rectal exam: yes.  Confirms.              Anus:  normal sphincter tone, no lesions  Pessary removed, cleansed, and replaced.   Chaperone was present for exam:  Estill Bamberg, CMA  Assessment:   Well woman visit with gynecologic exam. Hx right mastectomy from DCIS.  BRCA neg.  Incomplete uterovaginal prolapse.   Pessary working well.  Osteopenia.  Increased risk of fracture by FRAX model.  Declines treatment.  HSV 1.  Valtrex through PCP.   Plan: Mammogram screening discussed. Self breast awareness reviewed. Pap and reflex HR HPV Guidelines for Calcium, Vitamin D, regular exercise program including cardiovascular and weight bearing exercise. Continue pessary care.  Continue use of Astroglide BMD next year.  Follow up annually and prn.    After visit summary provided.

## 2021-02-22 LAB — CYTOLOGY - PAP: Diagnosis: NEGATIVE

## 2021-02-23 ENCOUNTER — Other Ambulatory Visit: Payer: Self-pay

## 2021-02-23 ENCOUNTER — Emergency Department (HOSPITAL_BASED_OUTPATIENT_CLINIC_OR_DEPARTMENT_OTHER): Payer: Medicare PPO | Admitting: Radiology

## 2021-02-23 ENCOUNTER — Encounter (HOSPITAL_BASED_OUTPATIENT_CLINIC_OR_DEPARTMENT_OTHER): Payer: Self-pay | Admitting: Obstetrics and Gynecology

## 2021-02-23 ENCOUNTER — Emergency Department (HOSPITAL_BASED_OUTPATIENT_CLINIC_OR_DEPARTMENT_OTHER)
Admission: EM | Admit: 2021-02-23 | Discharge: 2021-02-23 | Disposition: A | Discharge status: Home / Self Care | Payer: Medicare PPO | Attending: Emergency Medicine | Admitting: Emergency Medicine

## 2021-02-23 DIAGNOSIS — M25531 Pain in right wrist: Secondary | ICD-10-CM | POA: Insufficient documentation

## 2021-02-23 DIAGNOSIS — S8992XA Unspecified injury of left lower leg, initial encounter: Secondary | ICD-10-CM | POA: Diagnosis present

## 2021-02-23 DIAGNOSIS — Y93K1 Activity, walking an animal: Secondary | ICD-10-CM | POA: Diagnosis not present

## 2021-02-23 DIAGNOSIS — M25562 Pain in left knee: Secondary | ICD-10-CM | POA: Diagnosis not present

## 2021-02-23 DIAGNOSIS — Z79899 Other long term (current) drug therapy: Secondary | ICD-10-CM | POA: Insufficient documentation

## 2021-02-23 DIAGNOSIS — W19XXXA Unspecified fall, initial encounter: Secondary | ICD-10-CM | POA: Insufficient documentation

## 2021-02-23 DIAGNOSIS — Z853 Personal history of malignant neoplasm of breast: Secondary | ICD-10-CM | POA: Diagnosis not present

## 2021-02-23 DIAGNOSIS — I10 Essential (primary) hypertension: Secondary | ICD-10-CM | POA: Diagnosis not present

## 2021-02-23 DIAGNOSIS — S82001A Unspecified fracture of right patella, initial encounter for closed fracture: Secondary | ICD-10-CM | POA: Diagnosis not present

## 2021-02-23 DIAGNOSIS — S82092A Other fracture of left patella, initial encounter for closed fracture: Secondary | ICD-10-CM | POA: Diagnosis not present

## 2021-02-23 NOTE — ED Notes (Signed)
Ice has been applied to the area x1 hour following accident. Patient assisted into bed by this RN

## 2021-02-23 NOTE — ED Triage Notes (Signed)
Patient reports to the ER for mechanical fall while walking dog. Patient reports left knee pain and right wrist pain. Obvious swelling noted to knee

## 2021-02-23 NOTE — Discharge Instructions (Addendum)
Please refer to the attached instructions. You may take ibuprofen (Advil), 600-800 mg (3 or 4 OTC tablets) every 8 hours. Add tylenol, 1000 mg, 4 hours after each ibuprofen dose. Follow-up with orthopedics as discussed ([facility], that you have seen in the past, or with Dr. Griffin Basil, who is the on call orthopedic today).

## 2021-02-23 NOTE — ED Provider Notes (Signed)
[facility] EMERGENCY DEPT Provider Note   CSN: [id number] Arrival date & time: 02/23/21  1138     History Chief Complaint  Patient presents with   Kendra Velazquez is a 75 y.o. female.  Patient was walking her dog today. Dog pulled away from patient, resulting in mechanical fall. Patient landed on left knee and extended right hand. She is complaining pain to the right wrist and left knee. She denies loss of consciousness.  The history is provided by the patient. No language interpreter was used.  Fall This is a new problem. The current episode started 3 to 5 hours ago. The problem has not changed since onset.She has tried a cold compress (advil) for the symptoms. The treatment provided no relief.      Past Medical History:  Diagnosis Date   Allergy    Breast cancer ([facility])    right DCIS at age 89   Cystocele with prolapse    HSV-1 infection    Hypertension    Menopause 1998   Osteopenia 2017   FRAX - increased risk of fracture    Patient Active Problem List   Diagnosis Date Noted   Family history of brain cancer 10/04/2012   Breast cancer ([facility]) 10/04/2012    Past Surgical History:  Procedure Laterality Date   AUGMENTATION MAMMAPLASTY Right 09/2008   right breast reconstruction   BREAST BIOPSY Left 2017   MASTECTOMY Right 05/2008   DCIS Multicentric--Mastectomy  (neg) nodes---no tamox   MOHS SURGERY  05/22/2020   on nose for basal cell   TUBAL LIGATION     BTSP     OB History     Gravida  2   Para  2   Term      Preterm      AB      Living  2      SAB      IAB      Ectopic      Multiple      Live Births              Family History  Problem Relation Age of Onset   Osteoporosis Mother    Heart disease Father        died of heart attack age 7   Breast cancer Maternal Grandmother 54   Breast cancer Cousin 25       maternal 1st cousin   Osteoporosis Maternal Aunt    Colon cancer Neg Hx     Esophageal cancer Neg Hx    Rectal cancer Neg Hx    Stomach cancer Neg Hx     Social History   Tobacco Use   Smoking status: Never   Smokeless tobacco: Never  Vaping Use   Vaping Use: Never used  Substance Use Topics   Alcohol use: No    Alcohol/week: 0.0 standard drinks   Drug use: No    Home Medications Prior to Admission medications   Medication Sig Start Date End Date Taking? Authorizing Provider  docusate sodium (COLACE) 100 MG capsule Take 100 mg by mouth 2 (two) times daily.   Yes [provider]  hydrochlorothiazide (MICROZIDE) 12.5 MG capsule daily.  11/08/12  Yes [provider]  Multiple Vitamin (MULTIVITAMIN) tablet Take 1 tablet by mouth daily.   Yes [provider]  ramipril (ALTACE) 10 MG tablet Take 10 mg by mouth daily.   Yes [provider]  VITAMIN D PO Take 2,000 Int'l  Units by mouth.   Yes [provider]  cetirizine (ZYRTEC) 10 MG tablet Take 10 mg by mouth as needed for allergies.    [provider]  fluticasone (FLONASE) 50 MCG/ACT nasal spray Place into both nostrils as needed for allergies or rhinitis.    [provider]  valACYclovir (VALTREX) 1000 MG tablet Take 1 tablet by mouth as needed. Take 1 tablet prn for cold sores 12/11/14   [provider]    Allergies    Tetracyclines & related  Review of Systems   Review of Systems  Musculoskeletal:  Positive for arthralgias.  All other systems reviewed and are negative.  Physical Exam Updated Vital Signs BP (!) 161/62 (BP Location: Left Arm)   Pulse 66   Temp 98.6 F (37 C)   Resp 14   LMP 05/22/2000 (Approximate)   SpO2 98%   Physical Exam Constitutional:      Appearance: Normal appearance.  HENT:     Head: Normocephalic.     Nose: Nose normal.     Mouth/Throat:     Mouth: Mucous membranes are moist.     Comments: Minimal swelling to upper lip. No obvious bruising. No open wound. Teeth intact. Eyes:      Conjunctiva/sclera: Conjunctivae normal.  Cardiovascular:     Rate and Rhythm: Normal rate and regular rhythm.  Pulmonary:     Effort: Pulmonary effort is normal.     Breath sounds: Normal breath sounds.  Musculoskeletal:        General: Swelling, tenderness and signs of injury present.     Comments: Mild swelling to right wrist. No deformity. Distal PMS intact.   Bruising and tenderness to left patella. Distal PMS intact.  Skin:    General: Skin is warm and dry.  Neurological:     Mental Status: She is alert and oriented to person, place, and time.  Psychiatric:        Mood and Affect: Mood normal.        Behavior: Behavior normal.    ED Results / Procedures / Treatments   Labs (all labs ordered are listed, but only abnormal results are displayed) Labs Reviewed - No data to display  EKG None  Radiology DG Wrist Complete Right  Result Date: 02/23/2021 CLINICAL DATA:  Right wrist pain after fall. EXAM: RIGHT WRIST - COMPLETE 3+ VIEW COMPARISON:  None. FINDINGS: There is no evidence of fracture or dislocation. There is no evidence of arthropathy or other focal bone abnormality. Soft tissues are unremarkable. IMPRESSION: Negative. Electronically Signed   By: Marijo Conception M.D.   On: 02/23/2021 13:39   DG Knee Complete 4 Views Left  Result Date: 02/23/2021 CLINICAL DATA:  Left knee pain after fall. EXAM: LEFT KNEE - COMPLETE 4+ VIEW COMPARISON:  None. FINDINGS: Mildly displaced longitudinal patellar fracture is noted. Large suprapatellar joint effusion is noted. The visualized portions of the femur and tibia are unremarkable. IMPRESSION: Mildly displaced patellar fracture. Large suprapatellar joint effusion. Electronically Signed   By: Marijo Conception M.D.   On: 02/23/2021 13:36    Procedures Procedures   Medications Ordered in ED Medications - No data to display  ED Course  I have reviewed the triage vital signs and the nursing notes.  Pertinent labs & imaging results  that were available during my care of the patient were reviewed by me and considered in my medical decision making (see chart for details).    MDM Rules/Calculators/A&P  Patient with mechanical fall earlier today. Patient wrist X-Ray negative for obvious fracture or dislocation. Knee films positive for left patella fracture. Pt advised to follow up with orthopedics. Patient has seen [facility] in the past. Patient given knee immobilizer while in ED. Care instructions provided. Patient will be discharged home & is agreeable with above plan. Returns precautions discussed. Pt appears safe for discharge.  Final Clinical Impression(s) / ED Diagnoses Final diagnoses:  Fall, initial encounter  Right wrist pain  Other closed fracture of left patella, initial encounter    Rx / DC Orders ED Discharge Orders     None        Etta Quill, NP 02/23/21 2224    Regan Lemming, MD 02/23/21 2240

## 2021-02-24 DIAGNOSIS — M25531 Pain in right wrist: Secondary | ICD-10-CM | POA: Diagnosis not present

## 2021-02-24 DIAGNOSIS — S82022A Displaced longitudinal fracture of left patella, initial encounter for closed fracture: Secondary | ICD-10-CM | POA: Diagnosis not present

## 2021-03-03 DIAGNOSIS — S82002A Unspecified fracture of left patella, initial encounter for closed fracture: Secondary | ICD-10-CM | POA: Diagnosis not present

## 2021-03-24 DIAGNOSIS — S82002A Unspecified fracture of left patella, initial encounter for closed fracture: Secondary | ICD-10-CM | POA: Diagnosis not present

## 2021-03-24 DIAGNOSIS — S63501D Unspecified sprain of right wrist, subsequent encounter: Secondary | ICD-10-CM | POA: Diagnosis not present

## 2021-04-05 DIAGNOSIS — L570 Actinic keratosis: Secondary | ICD-10-CM | POA: Diagnosis not present

## 2021-04-05 DIAGNOSIS — Z08 Encounter for follow-up examination after completed treatment for malignant neoplasm: Secondary | ICD-10-CM | POA: Diagnosis not present

## 2021-04-05 DIAGNOSIS — Z85828 Personal history of other malignant neoplasm of skin: Secondary | ICD-10-CM | POA: Diagnosis not present

## 2021-04-05 DIAGNOSIS — D225 Melanocytic nevi of trunk: Secondary | ICD-10-CM | POA: Diagnosis not present

## 2021-04-05 DIAGNOSIS — L718 Other rosacea: Secondary | ICD-10-CM | POA: Diagnosis not present

## 2021-04-05 DIAGNOSIS — L814 Other melanin hyperpigmentation: Secondary | ICD-10-CM | POA: Diagnosis not present

## 2021-04-05 DIAGNOSIS — S80922A Unspecified superficial injury of left lower leg, initial encounter: Secondary | ICD-10-CM | POA: Diagnosis not present

## 2021-04-05 DIAGNOSIS — C44529 Squamous cell carcinoma of skin of other part of trunk: Secondary | ICD-10-CM | POA: Diagnosis not present

## 2021-04-05 DIAGNOSIS — L218 Other seborrheic dermatitis: Secondary | ICD-10-CM | POA: Diagnosis not present

## 2021-04-05 DIAGNOSIS — L821 Other seborrheic keratosis: Secondary | ICD-10-CM | POA: Diagnosis not present

## 2021-04-07 DIAGNOSIS — S82002D Unspecified fracture of left patella, subsequent encounter for closed fracture with routine healing: Secondary | ICD-10-CM | POA: Diagnosis not present

## 2021-04-21 DIAGNOSIS — S82002A Unspecified fracture of left patella, initial encounter for closed fracture: Secondary | ICD-10-CM | POA: Diagnosis not present

## 2021-04-25 DIAGNOSIS — S82002D Unspecified fracture of left patella, subsequent encounter for closed fracture with routine healing: Secondary | ICD-10-CM | POA: Diagnosis not present

## 2021-05-06 DIAGNOSIS — S82002D Unspecified fracture of left patella, subsequent encounter for closed fracture with routine healing: Secondary | ICD-10-CM | POA: Diagnosis not present

## 2021-05-11 DIAGNOSIS — S82002D Unspecified fracture of left patella, subsequent encounter for closed fracture with routine healing: Secondary | ICD-10-CM | POA: Diagnosis not present

## 2021-05-25 DIAGNOSIS — S82002D Unspecified fracture of left patella, subsequent encounter for closed fracture with routine healing: Secondary | ICD-10-CM | POA: Diagnosis not present

## 2021-05-27 DIAGNOSIS — S82002D Unspecified fracture of left patella, subsequent encounter for closed fracture with routine healing: Secondary | ICD-10-CM | POA: Diagnosis not present

## 2021-05-30 DIAGNOSIS — S82002D Unspecified fracture of left patella, subsequent encounter for closed fracture with routine healing: Secondary | ICD-10-CM | POA: Diagnosis not present

## 2021-06-02 DIAGNOSIS — S82002D Unspecified fracture of left patella, subsequent encounter for closed fracture with routine healing: Secondary | ICD-10-CM | POA: Diagnosis not present

## 2021-06-06 DIAGNOSIS — S82002D Unspecified fracture of left patella, subsequent encounter for closed fracture with routine healing: Secondary | ICD-10-CM | POA: Diagnosis not present

## 2021-06-07 DIAGNOSIS — Z09 Encounter for follow-up examination after completed treatment for conditions other than malignant neoplasm: Secondary | ICD-10-CM | POA: Diagnosis not present

## 2021-06-07 DIAGNOSIS — Z08 Encounter for follow-up examination after completed treatment for malignant neoplasm: Secondary | ICD-10-CM | POA: Diagnosis not present

## 2021-06-07 DIAGNOSIS — Z872 Personal history of diseases of the skin and subcutaneous tissue: Secondary | ICD-10-CM | POA: Diagnosis not present

## 2021-06-07 DIAGNOSIS — L570 Actinic keratosis: Secondary | ICD-10-CM | POA: Diagnosis not present

## 2021-06-07 DIAGNOSIS — Z86007 Personal history of in-situ neoplasm of skin: Secondary | ICD-10-CM | POA: Diagnosis not present

## 2021-06-09 DIAGNOSIS — S82002D Unspecified fracture of left patella, subsequent encounter for closed fracture with routine healing: Secondary | ICD-10-CM | POA: Diagnosis not present

## 2021-08-03 DIAGNOSIS — Z08 Encounter for follow-up examination after completed treatment for malignant neoplasm: Secondary | ICD-10-CM | POA: Diagnosis not present

## 2021-08-03 DIAGNOSIS — L814 Other melanin hyperpigmentation: Secondary | ICD-10-CM | POA: Diagnosis not present

## 2021-08-03 DIAGNOSIS — L718 Other rosacea: Secondary | ICD-10-CM | POA: Diagnosis not present

## 2021-08-03 DIAGNOSIS — Z85828 Personal history of other malignant neoplasm of skin: Secondary | ICD-10-CM | POA: Diagnosis not present

## 2021-08-03 DIAGNOSIS — L218 Other seborrheic dermatitis: Secondary | ICD-10-CM | POA: Diagnosis not present

## 2021-08-03 DIAGNOSIS — L570 Actinic keratosis: Secondary | ICD-10-CM | POA: Diagnosis not present

## 2021-08-03 DIAGNOSIS — D225 Melanocytic nevi of trunk: Secondary | ICD-10-CM | POA: Diagnosis not present

## 2021-08-03 DIAGNOSIS — L821 Other seborrheic keratosis: Secondary | ICD-10-CM | POA: Diagnosis not present

## 2021-11-15 ENCOUNTER — Other Ambulatory Visit: Payer: Self-pay | Admitting: Family Medicine

## 2021-11-15 DIAGNOSIS — Z1231 Encounter for screening mammogram for malignant neoplasm of breast: Secondary | ICD-10-CM

## 2021-12-22 ENCOUNTER — Encounter: Payer: Self-pay | Admitting: *Deleted

## 2021-12-22 ENCOUNTER — Other Ambulatory Visit: Payer: Self-pay | Admitting: *Deleted

## 2021-12-22 NOTE — Patient Instructions (Signed)
Visit Information  Thank you for taking time to visit with me today. Please don't hesitate to contact me if I can be of assistance to you before our next scheduled telephone appointment.  Following are the goals we discussed today:  Reviewed medications with patient and discussed purpose of all medications Reviewed scheduled/upcoming provider appointments including upcoming appointments Screening for signs and symptoms of depression related to chronic disease state  Assessed social determinant of health barriers  Please call the care guide team at [phone] if you need to cancel or reschedule your appointment.   Please call the Suicide and Crisis Lifeline: 988 call the [place] National Suicide Prevention Lifeline: 717-503-5070 or TTY: (502)027-7219 TTY 313-832-4115) to talk to a trained counselor call 1-800-273-TALK (toll free, 24 hour hotline) if you are experiencing a Mental Health or [place] or need someone to talk to.  Patient verbalizes understanding of instructions and care plan provided today and agrees to view in [place]. Active MyChart status and patient understanding of how to access instructions and care plan via MyChart confirmed with patient.     The patient has been provided with contact information for the care management team and has been advised to call with any health related questions or concerns.  No further follow up required: Pt indicated no needs at this time.  Raina Mina, RN Care Management Coordinator [place] [facility] [phone]

## 2021-12-22 NOTE — Patient Outreach (Signed)
  Care Coordination   Initial Visit Note   12/22/2021 Name: Kendra Velazquez MRN: [id number] DOB: December 20, 1945  Kendra Velazquez is a 76 y.o. year old female who sees Lindell Noe, Anastasia Pall, MD for primary care. I spoke with  Karolee Ohs by phone today  What matters to the patients health and wellness today?  No needs    Goals Addressed               This Visit's Progress     no needs (pt-stated)        Care Coordination Interventions: Reviewed medications with patient and discussed purpose of all medications Reviewed scheduled/upcoming provider appointments including upcoming appointments Screening for signs and symptoms of depression related to chronic disease state  Assessed social determinant of health barriers        SDOH assessments and interventions completed:  Yes  SDOH Interventions Today    Flowsheet Row Most Recent Value  SDOH Interventions   Food Insecurity Interventions Intervention Not Indicated  Transportation Interventions Intervention Not Indicated        Care Coordination Interventions Activated:  Yes  Care Coordination Interventions:  Yes, provided   Follow up plan: No further intervention required.   Encounter Outcome:  Pt. Visit Completed    Raina Mina, RN Care Management Coordinator [place] Office [phone]

## 2021-12-29 ENCOUNTER — Ambulatory Visit
Admission: RE | Admit: 2021-12-29 | Discharge: 2021-12-29 | Disposition: A | Discharge status: Home / Self Care | Payer: Medicare PPO | Source: Ambulatory Visit | Attending: Family Medicine | Admitting: Family Medicine

## 2021-12-29 DIAGNOSIS — Z1231 Encounter for screening mammogram for malignant neoplasm of breast: Secondary | ICD-10-CM | POA: Diagnosis not present

## 2022-02-14 DIAGNOSIS — Z85828 Personal history of other malignant neoplasm of skin: Secondary | ICD-10-CM | POA: Diagnosis not present

## 2022-02-14 DIAGNOSIS — L821 Other seborrheic keratosis: Secondary | ICD-10-CM | POA: Diagnosis not present

## 2022-02-14 DIAGNOSIS — Z08 Encounter for follow-up examination after completed treatment for malignant neoplasm: Secondary | ICD-10-CM | POA: Diagnosis not present

## 2022-02-14 DIAGNOSIS — L814 Other melanin hyperpigmentation: Secondary | ICD-10-CM | POA: Diagnosis not present

## 2022-02-14 DIAGNOSIS — C44622 Squamous cell carcinoma of skin of right upper limb, including shoulder: Secondary | ICD-10-CM | POA: Diagnosis not present

## 2022-02-14 DIAGNOSIS — D225 Melanocytic nevi of trunk: Secondary | ICD-10-CM | POA: Diagnosis not present

## 2022-02-16 NOTE — Progress Notes (Signed)
76 y.o. G2P2 Married Caucasian female here for an office visit.  She also desires a breast and pelvic exam.   Patient is followed for incomplete uterovaginal prolapse and osteopenia.  She has a pessary, and it is working well.  She wishes to continue this.  Takes it out once a week overnight.  Has a little bit of leakage from the vagina.  She is not certain if it is vaginal discharge or urine.  No vaginal discharge discoloration.  No bleeding.  She uses [employer] externally only.   Broke a left knee cap last year.  Dog ran across her while on a leash.   Has oral HSV.  Rare outbreak.  Would like a refill of Valtrex.  Denies decreased kidney function.  Takes Tums and vit D daily.  PCP:   Sela Hilding, MD  Patient's last menstrual period was 05/22/2000 (approximate).           Sexually active: No.  The current method of family planning is post menopausal status.    Exercising: Yes.     Walking, yoga, osteo strong, weights Smoker:  no  Health Maintenance: Pap:  02-21-21 neg, 02-13-19 neg, 02-07-17 neg History of abnormal Pap:  no MMG:  12-29-21 left breast category a density birads 1:neg, rt breast mastectomy Colonoscopy:  01-08-18 22m polyp, f/u 561yrBMD:   12-09-19  Result  osteopenia of hip TDaP:  2023 Gardasil:   no HIV: not done Hep C: not done Screening Labs:  PCP Will do flu vaccine this month with PCP.  Undecided about Covid booster.    reports that she has never smoked. She has never used smokeless tobacco. She reports that she does not drink alcohol and does not use drugs.  Past Medical History:  Diagnosis Date   Allergy    Breast cancer ([facility]   right DCIS at age 76 Cystocele with prolapse    History of fractured kneecap    2022   HSV-1 infection    Hypertension    Menopause 1998   Osteopenia 2017   FRAX - increased risk of fracture    Past Surgical History:  Procedure Laterality Date   AUGMENTATION MAMMAPLASTY Right 09/2008   right breast  reconstruction   BREAST BIOPSY Left 2017   MASTECTOMY Right 05/2008   DCIS Multicentric--Mastectomy  (neg) nodes---no tamox   MOHS SURGERY  05/22/2020   on nose for basal cell   TUBAL LIGATION     BTSP    Current Outpatient Medications  Medication Sig Dispense Refill   docusate sodium (COLACE) 100 MG capsule Take 100 mg by mouth 2 (two) times daily.     fexofenadine (ALLEGRA) 180 MG tablet Take 180 mg by mouth daily.     fluticasone (FLONASE) 50 MCG/ACT nasal spray Place into both nostrils as needed for allergies or rhinitis.     hydrochlorothiazide (MICROZIDE) 12.5 MG capsule daily.      Multiple Vitamin (MULTIVITAMIN) tablet Take 1 tablet by mouth daily.     NIACINAMIDE PO Take by mouth.     ramipril (ALTACE) 10 MG tablet Take 10 mg by mouth daily.     valACYclovir (VALTREX) 1000 MG tablet Take 1 tablet by mouth as needed. Take 1 tablet prn for cold sores  0   VITAMIN D PO Take 2,000 Int'l Units by mouth. Vitamin d3     No current facility-administered medications for this visit.    Family History  Problem Relation Age of Onset  Osteoporosis Mother    Heart disease Father        died of heart attack age 48   Osteoporosis Maternal Aunt    Breast cancer Maternal Grandmother 70   Breast cancer Cousin 65       maternal 1st cousin    Review of Systems  Constitutional: Negative.   HENT: Negative.    Eyes: Negative.   Respiratory: Negative.    Cardiovascular: Negative.   Gastrointestinal: Negative.   Endocrine: Negative.   Genitourinary: Negative.   Musculoskeletal: Negative.   Skin: Negative.   Allergic/Immunologic: Negative.   Neurological: Negative.   Hematological: Negative.   Psychiatric/Behavioral: Negative.      Exam:   BP 110/74   Pulse 71   Ht 5' 4.75" (1.645 m)   Wt 129 lb (58.5 kg)   LMP 05/22/2000 (Approximate) Comment: BTL  SpO2 97%   BMI 21.63 kg/m     General appearance: alert, cooperative and appears stated age Head: normocephalic, without  obvious abnormality, atraumatic Neck: no adenopathy. Lungs: clear to auscultation bilaterally Breasts: right breast absent with reconstruction present.  Left breast - normal appearance, no masses or tenderness, No nipple retraction or dimpling, No nipple discharge or bleeding, No axillary adenopathy Heart: regular rate and rhythm Abdomen: soft, non-tender; no masses, no organomegaly Extremities: extremities normal, atraumatic, no cyanosis or edema Skin: skin color, texture, turgor normal. No rashes or lesions Lymph nodes: cervical, supraclavicular, and axillary nodes normal. Neurologic: grossly normal  Pelvic: External genitalia:  no lesions              No abnormal inguinal nodes palpated.              Urethra:  normal appearing urethra with no masses, tenderness or lesions              Bartholins and Skenes: normal                 Vagina: small area of erythema at the posterior vaginal apex.              Cervix: no lesions              Pap taken: no Bimanual Exam:  Uterus:  normal size, contour, position, consistency, mobility, non-tender              Adnexa: no mass, fullness, tenderness              Rectal exam: yes.  Confirms.              Anus:  normal sphincter tone, no lesions  Pessary removed, cleansed and replaced.   Chaperone was present for exam:  Joy, CMA  Assessment:   Well woman visit with gynecologic exam. Hx right mastectomy from DCIS.  BRCA neg.  Incomplete uterovaginal prolapse.   Pessary maintenance. Menopause. Osteopenia.  Increased risk of fracture by FRAX model.  Declines treatment.  Oral HSV 1.   Plan: Mammogram screening discussed. Self breast awareness reviewed. Pap 2024. Guidelines for Calcium, Vitamin D, regular exercise program including cardiovascular and weight bearing exercise. Continue pessary care.  Use [place] Jelly twice a week.  Refill of Valtrex.   BMD ordered.  Patient may wait until next year to do when she has her screening mammogram.   Follow up for breast and pelvic and pap and follow up in 1 year and prn.   35 min  total time was spent for this patient encounter, including preparation, face-to-face counseling with the  patient, coordination of care, and documentation of the encounter.  After visit summary provided.

## 2022-02-22 ENCOUNTER — Ambulatory Visit (INDEPENDENT_AMBULATORY_CARE_PROVIDER_SITE_OTHER): Payer: Medicare PPO | Admitting: Obstetrics and Gynecology

## 2022-02-22 ENCOUNTER — Encounter: Payer: Self-pay | Admitting: Obstetrics and Gynecology

## 2022-02-22 VITALS — BP 110/74 | HR 71 | Ht 64.75 in | Wt 129.0 lb

## 2022-02-22 DIAGNOSIS — Z4689 Encounter for fitting and adjustment of other specified devices: Secondary | ICD-10-CM

## 2022-02-22 DIAGNOSIS — M85859 Other specified disorders of bone density and structure, unspecified thigh: Secondary | ICD-10-CM | POA: Diagnosis not present

## 2022-02-22 DIAGNOSIS — B009 Herpesviral infection, unspecified: Secondary | ICD-10-CM

## 2022-02-22 DIAGNOSIS — Z9289 Personal history of other medical treatment: Secondary | ICD-10-CM | POA: Diagnosis not present

## 2022-02-22 DIAGNOSIS — M8580 Other specified disorders of bone density and structure, unspecified site: Secondary | ICD-10-CM

## 2022-02-22 DIAGNOSIS — Z780 Asymptomatic menopausal state: Secondary | ICD-10-CM | POA: Diagnosis not present

## 2022-02-22 DIAGNOSIS — Z01419 Encounter for gynecological examination (general) (routine) without abnormal findings: Secondary | ICD-10-CM

## 2022-02-22 DIAGNOSIS — Z9189 Other specified personal risk factors, not elsewhere classified: Secondary | ICD-10-CM | POA: Diagnosis not present

## 2022-02-22 DIAGNOSIS — Z853 Personal history of malignant neoplasm of breast: Secondary | ICD-10-CM | POA: Diagnosis not present

## 2022-02-22 MED ORDER — VALACYCLOVIR HCL 1 G PO TABS: ORAL_TABLET | ORAL | 1 refills | Status: DC

## 2022-02-22 NOTE — Patient Instructions (Signed)

## 2022-03-13 DIAGNOSIS — Z79899 Other long term (current) drug therapy: Secondary | ICD-10-CM | POA: Diagnosis not present

## 2022-03-13 DIAGNOSIS — E559 Vitamin D deficiency, unspecified: Secondary | ICD-10-CM | POA: Diagnosis not present

## 2022-03-13 DIAGNOSIS — E7800 Pure hypercholesterolemia, unspecified: Secondary | ICD-10-CM | POA: Diagnosis not present

## 2022-03-15 DIAGNOSIS — I10 Essential (primary) hypertension: Secondary | ICD-10-CM | POA: Diagnosis not present

## 2022-03-15 DIAGNOSIS — Z23 Encounter for immunization: Secondary | ICD-10-CM | POA: Diagnosis not present

## 2022-03-15 DIAGNOSIS — E559 Vitamin D deficiency, unspecified: Secondary | ICD-10-CM | POA: Diagnosis not present

## 2022-03-15 DIAGNOSIS — Z79899 Other long term (current) drug therapy: Secondary | ICD-10-CM | POA: Diagnosis not present

## 2022-03-15 DIAGNOSIS — Z0000 Encounter for general adult medical examination without abnormal findings: Secondary | ICD-10-CM | POA: Diagnosis not present

## 2022-03-15 DIAGNOSIS — E7800 Pure hypercholesterolemia, unspecified: Secondary | ICD-10-CM | POA: Diagnosis not present

## 2022-04-06 DIAGNOSIS — I10 Essential (primary) hypertension: Secondary | ICD-10-CM | POA: Diagnosis not present

## 2022-06-07 IMAGING — DX DG KNEE COMPLETE 4+V*L*
5 series · 5 of 5 positions shown · non-contrast
Comparison: None.

CLINICAL DATA: Left knee pain after fall.

EXAM:
LEFT KNEE - COMPLETE 4+ VIEW

[knee ap]
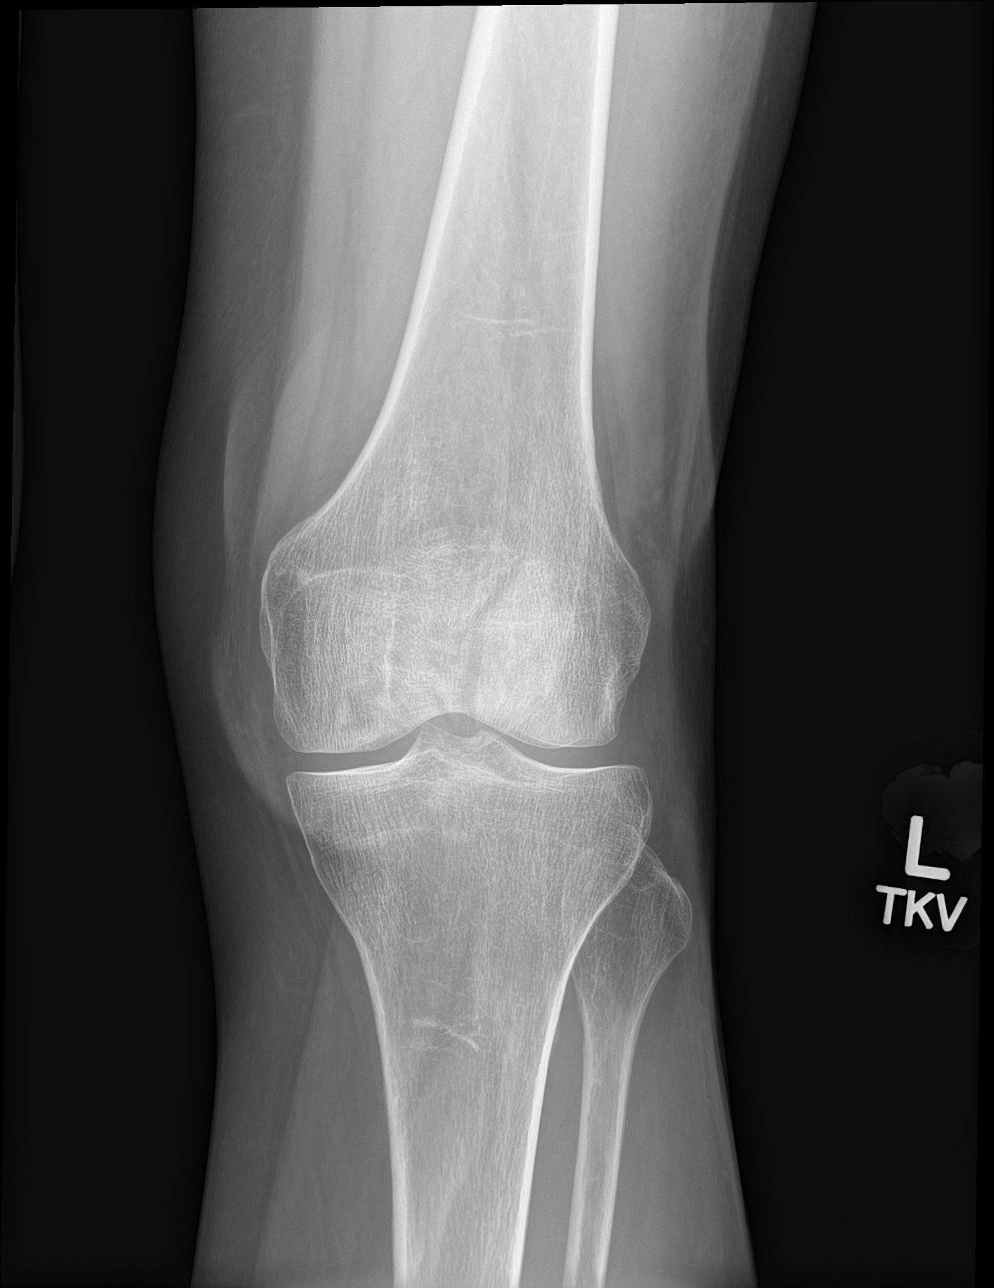

[knee obl (1 of 2)]
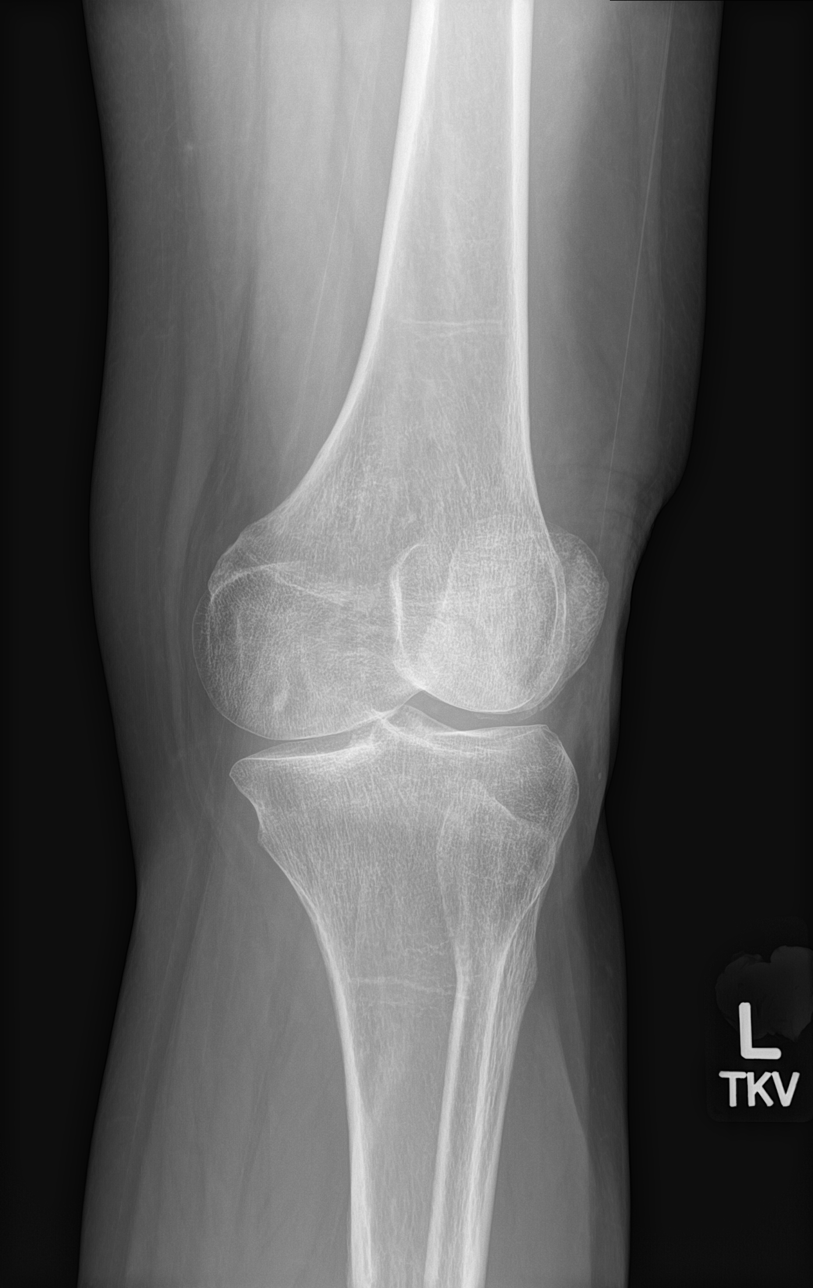

[knee obl (2 of 2)]
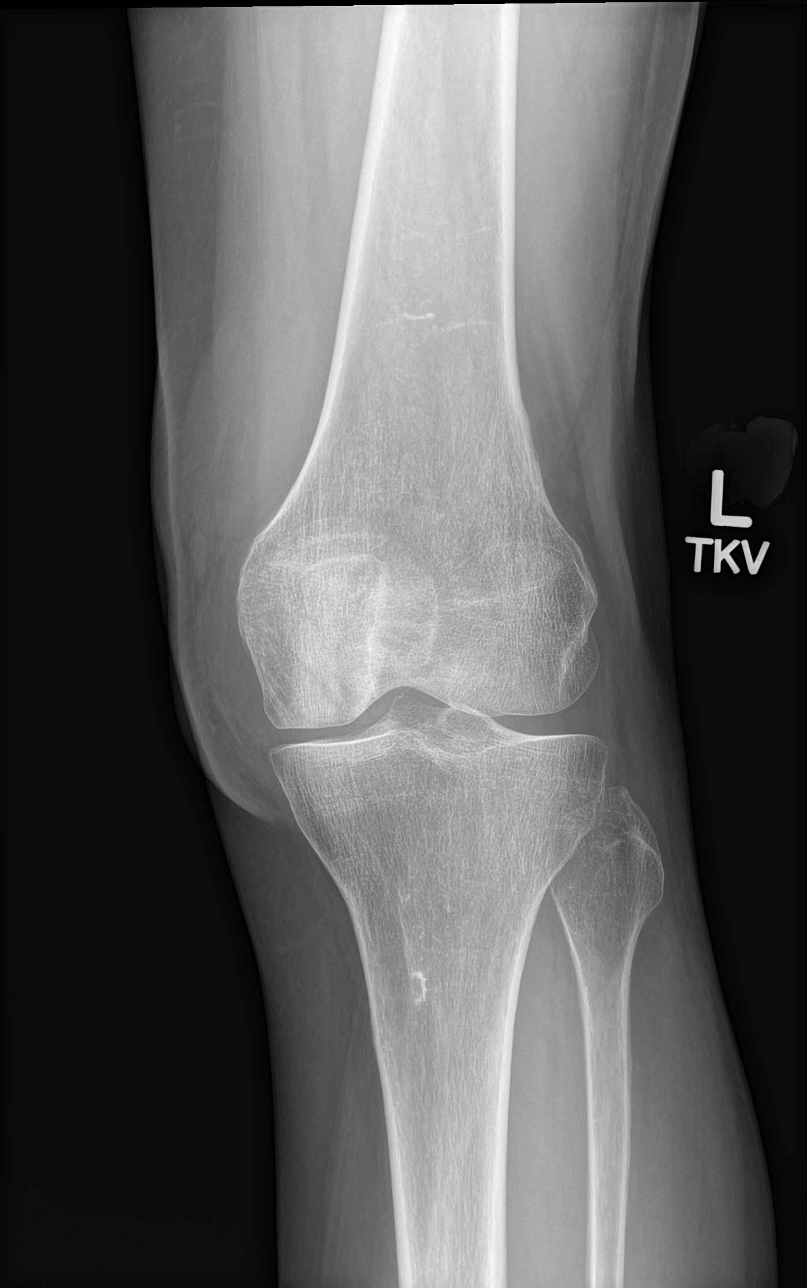

[knee lat]
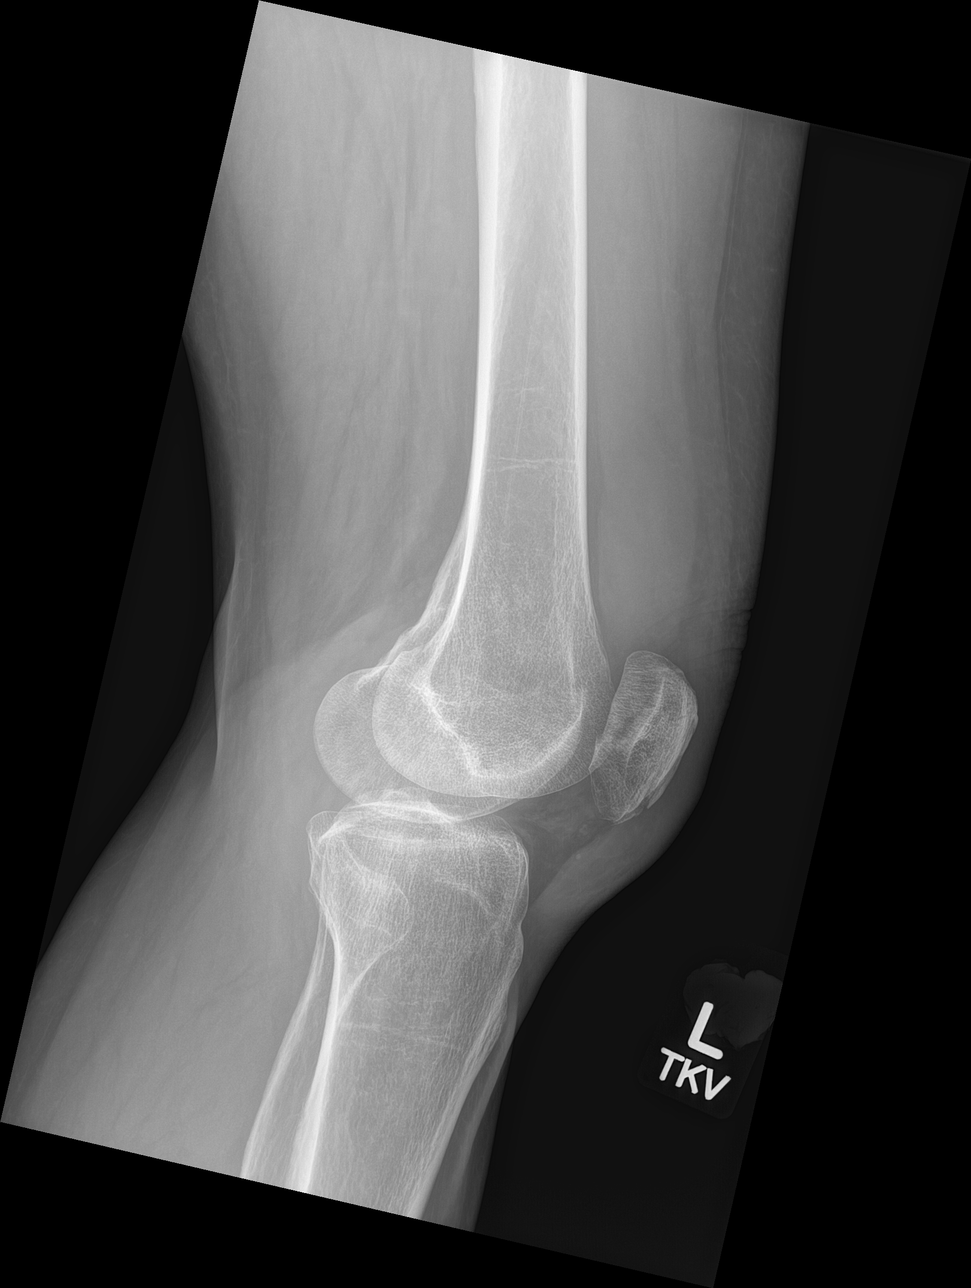

[patella skyline]
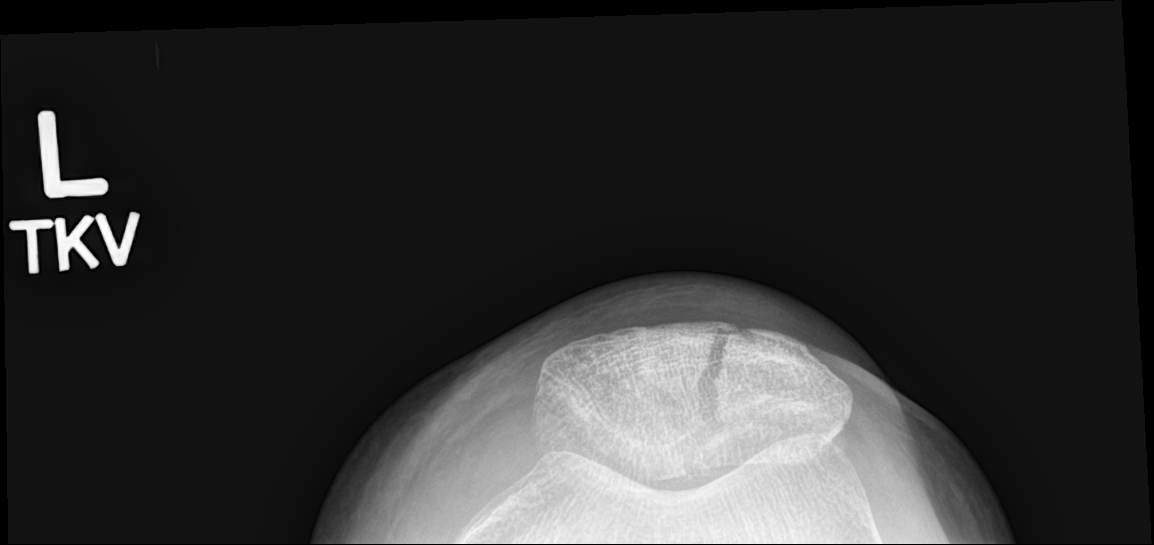

[5 of 5 positions shown; findings below may reference images not displayed]

FINDINGS: Mildly displaced longitudinal patellar fracture is noted. Large
suprapatellar joint effusion is noted. The visualized portions of
the femur and tibia are unremarkable.
IMPRESSION: Mildly displaced patellar fracture. Large suprapatellar joint
effusion.

## 2022-06-07 IMAGING — DX DG WRIST COMPLETE 3+V*R*
4 series · 4 of 4 positions shown · non-contrast
Comparison: None.

CLINICAL DATA: Right wrist pain after fall.

EXAM:
RIGHT WRIST - COMPLETE 3+ VIEW

[wrist ap]
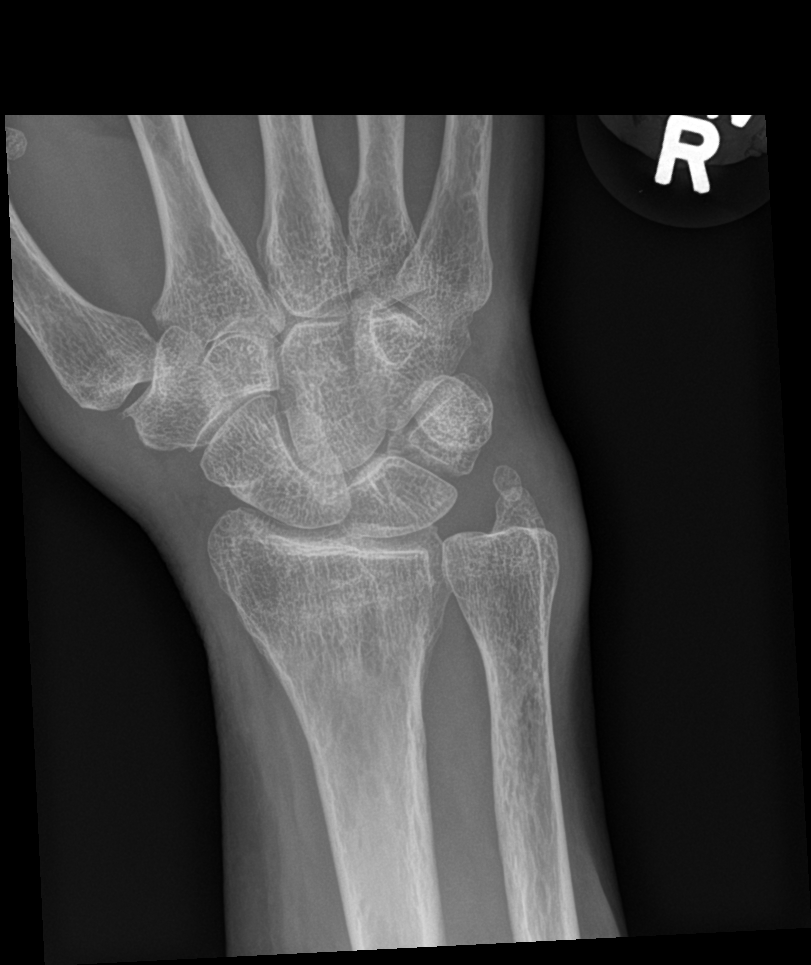

[wrist obl]
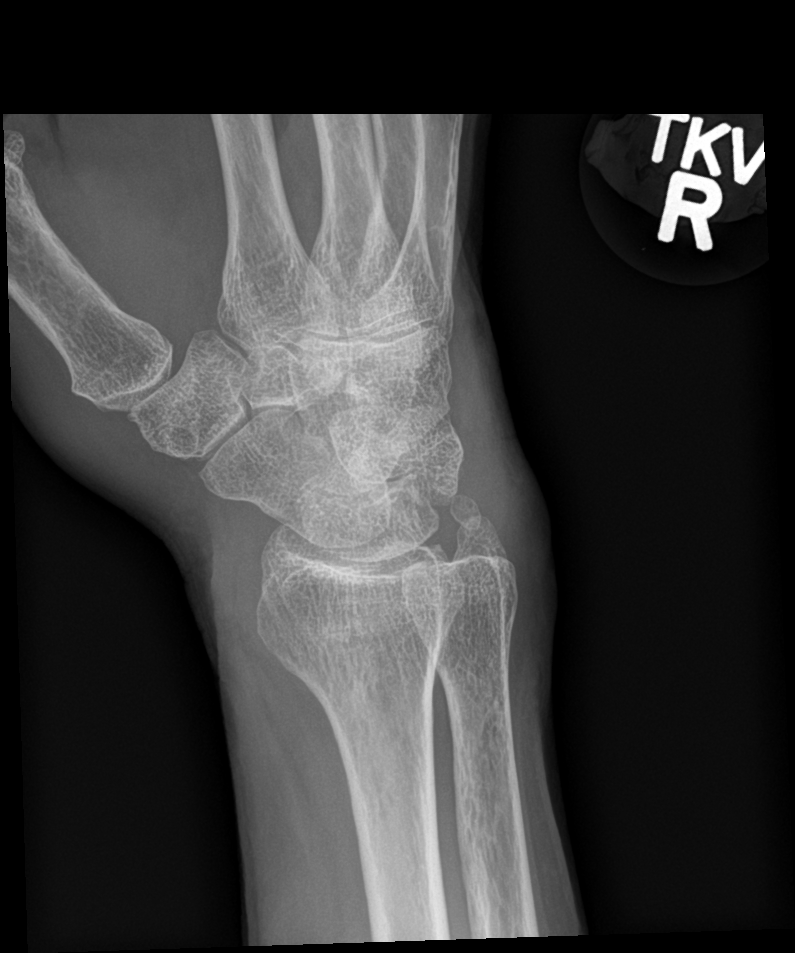

[wrist lat]
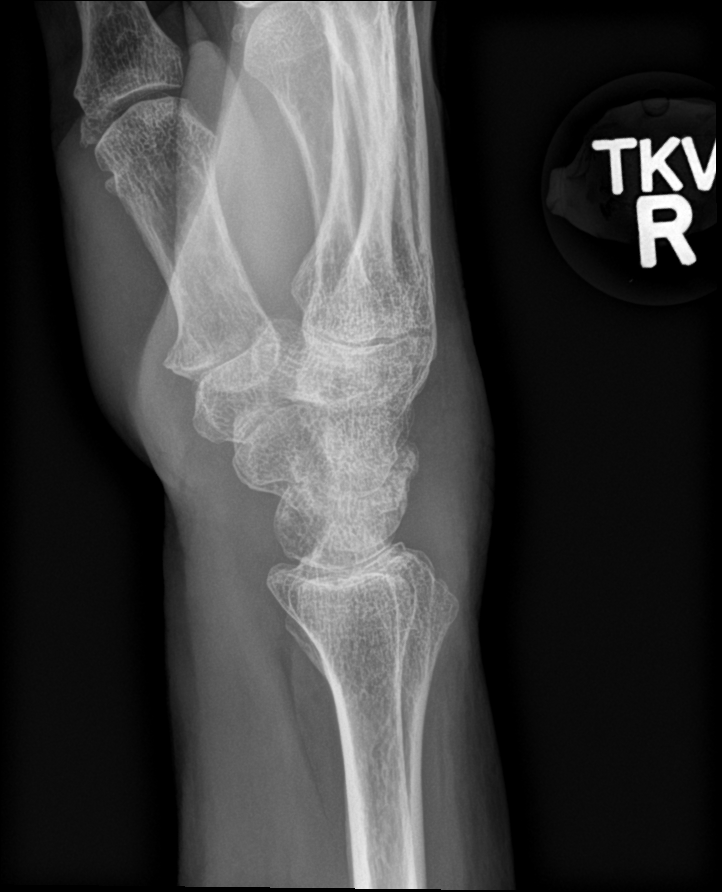

[wrist navicular]
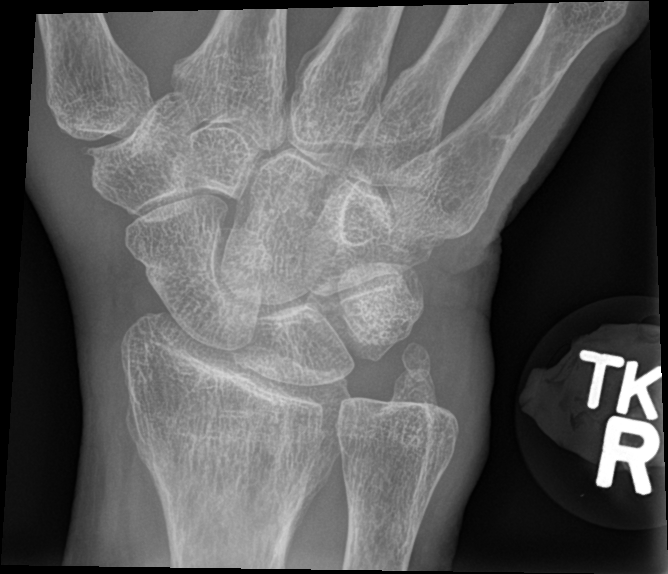

[4 of 4 positions shown; findings below may reference images not displayed]

FINDINGS: There is no evidence of fracture or dislocation. There is no
evidence of arthropathy or other focal bone abnormality. Soft
tissues are unremarkable.
IMPRESSION: Negative.

## 2022-08-15 DIAGNOSIS — L218 Other seborrheic dermatitis: Secondary | ICD-10-CM | POA: Diagnosis not present

## 2022-08-15 DIAGNOSIS — L570 Actinic keratosis: Secondary | ICD-10-CM | POA: Diagnosis not present

## 2022-08-15 DIAGNOSIS — Z85828 Personal history of other malignant neoplasm of skin: Secondary | ICD-10-CM | POA: Diagnosis not present

## 2022-08-15 DIAGNOSIS — L814 Other melanin hyperpigmentation: Secondary | ICD-10-CM | POA: Diagnosis not present

## 2022-08-15 DIAGNOSIS — D225 Melanocytic nevi of trunk: Secondary | ICD-10-CM | POA: Diagnosis not present

## 2022-08-15 DIAGNOSIS — L821 Other seborrheic keratosis: Secondary | ICD-10-CM | POA: Diagnosis not present

## 2022-08-15 DIAGNOSIS — Z08 Encounter for follow-up examination after completed treatment for malignant neoplasm: Secondary | ICD-10-CM | POA: Diagnosis not present

## 2022-08-15 DIAGNOSIS — L718 Other rosacea: Secondary | ICD-10-CM | POA: Diagnosis not present

## 2022-11-15 ENCOUNTER — Encounter: Payer: Self-pay | Admitting: Internal Medicine

## 2022-11-28 ENCOUNTER — Other Ambulatory Visit: Payer: Self-pay | Admitting: Obstetrics and Gynecology

## 2022-11-28 ENCOUNTER — Other Ambulatory Visit: Payer: Self-pay | Admitting: Family Medicine

## 2022-11-28 DIAGNOSIS — Z780 Asymptomatic menopausal state: Secondary | ICD-10-CM

## 2022-11-28 DIAGNOSIS — Z1231 Encounter for screening mammogram for malignant neoplasm of breast: Secondary | ICD-10-CM

## 2023-01-12 ENCOUNTER — Ambulatory Visit
Admission: RE | Admit: 2023-01-12 | Discharge: 2023-01-12 | Disposition: A | Discharge status: Home / Self Care | Payer: Medicare PPO | Source: Ambulatory Visit | Attending: Family Medicine | Admitting: Family Medicine

## 2023-01-12 ENCOUNTER — Other Ambulatory Visit: Payer: Self-pay | Admitting: Family Medicine

## 2023-01-12 DIAGNOSIS — Z1231 Encounter for screening mammogram for malignant neoplasm of breast: Secondary | ICD-10-CM | POA: Diagnosis not present

## 2023-02-14 NOTE — Progress Notes (Signed)
77 y.o. G64P2002 Married Caucasian female here for annual exam.    Patient is followed for incomplete uterovaginal prolapse, osteopenia, and oral HSV.   She uses a pessary.   Removing it once a week.  Uses water based lubricant.  No bleeding, discomfort, or discharge.   Has some urinary leakage with brushing her teeth or if waits too long.  Declines any treatment for this.   Bowel movements are normal.  No accidental leakage of stool.  Now on increased Altace.dose to control blood pressure.   PCP:   Dr. Chanetta Marshall  Patient's last menstrual period was 05/22/2000 (approximate).           Sexually active: No.  The current method of family planning is tubal ligation/PMP.    Exercising: Yes.     Walking, lift weights, yoga Smoker:  no  Health Maintenance: Pap:  02-21-21 neg, 02-13-19 neg, 02-07-17 neg  History of abnormal Pap:  no MMG:   01/12/23 - BI-RADS1, cat B density, rt breast mastectomy  Colonoscopy:  01-08-18 1mm polyp, f/u 9yrs  BMD:   12/09/19  Result  osteopenia.  Schedule for January.  TDaP:  2023 Gardasil:   no HIV: not done Hep C: not done Screening Labs:  PCP Covid and flu vaccine discussed.   reports that she has never smoked. She has never used smokeless tobacco. She reports that she does not drink alcohol and does not use drugs.  Past Medical History:  Diagnosis Date   Allergy    Breast cancer ([facility])    right DCIS at age 65   Cystocele with prolapse    History of fractured kneecap    2022   HSV-1 infection    Hypertension    Menopause 1998   Osteopenia 2017   FRAX - increased risk of fracture    Past Surgical History:  Procedure Laterality Date   AUGMENTATION MAMMAPLASTY Right 09/2008   right breast reconstruction   BREAST BIOPSY Left 2017   MASTECTOMY Right 05/2008   DCIS Multicentric--Mastectomy  (neg) nodes---no tamox   MOHS SURGERY  05/22/2020   on nose for basal cell   TUBAL LIGATION     BTSP    Current Outpatient Medications   Medication Sig Dispense Refill   cetirizine (ZYRTEC) 10 MG tablet Take by mouth.     docusate sodium (COLACE) 100 MG capsule Take 100 mg by mouth 2 (two) times daily.     fluticasone (FLONASE) 50 MCG/ACT nasal spray Place into both nostrils as needed for allergies or rhinitis.     hydrochlorothiazide (MICROZIDE) 12.5 MG capsule daily.      Multiple Vitamin (MULTIVITAMIN) tablet Take 1 tablet by mouth daily.     NIACINAMIDE PO Take by mouth.     ramipril (ALTACE) 10 MG tablet Take 10 mg by mouth daily.     valACYclovir (VALTREX) 1000 MG tablet Take 2 tablets (2000 mg) by mouth twice daily for 24 hours as needed for cold sores. 30 tablet 1   VITAMIN D PO Take 2,000 Int'l Units by mouth. Vitamin d3     No current facility-administered medications for this visit.    Family History  Problem Relation Age of Onset   Osteoporosis Mother    Heart disease Father        died of heart attack age 49   Osteoporosis Maternal Aunt    Breast cancer Maternal Grandmother 51   Breast cancer Cousin 29       maternal 1st cousin  Review of Systems  All other systems reviewed and are negative.   Exam:   BP 128/70 (BP Location: Left Arm, Patient Position: Sitting, Cuff Size: Normal)   Pulse 68   Ht 5' 4.5" (1.638 m)   Wt 127 lb (57.6 kg)   LMP 05/22/2000 (Approximate) Comment: BTL  SpO2 99%   BMI 21.46 kg/m     General appearance: alert, cooperative and appears stated age Head: normocephalic, without obvious abnormality, atraumatic Neck: no adenopathy, supple, symmetrical, trachea midline and thyroid normal to inspection and palpation Lungs: clear to auscultation bilaterally Breasts: right - breast absent and reconstruction present.  No axillary adenopathy.  Left - normal appearance, no masses or tenderness, No nipple retraction or dimpling, No nipple discharge or bleeding, No axillary adenopathy Heart: regular rate and rhythm Abdomen: soft, non-tender; no masses, no  organomegaly Extremities: extremities normal, atraumatic, no cyanosis or edema Skin: skin color, texture, turgor normal. No rashes or lesions Lymph nodes: cervical, supraclavicular, and axillary nodes normal. Neurologic: grossly normal  Pelvic: External genitalia:  no lesions              No abnormal inguinal nodes palpated.              Urethra:  normal appearing urethra with no masses, tenderness or lesions              Bartholins and Skenes: normal                 Vagina: vaginal cuff and cervical ulceration.              Cervix: no lesions              Pap taken: no Bimanual Exam:  Uterus:  normal size, contour, position, consistency, mobility, non-tender              Adnexa: no mass, fullness, tenderness              Rectal exam: yes.  Confirms.              Anus:  normal sphincter tone, no lesions  Pessary removed, cleansed, and given to patient ina biobag.  Chaperone was present for exam:  Senaida Ores, CMA  Assessment:   GYN exam for high risk Medicare patient.  Encounter for breast and pelvic exam.  Hx right mastectomy from DCIS.  BRCA neg.  Incomplete uterovaginal prolapse.   Pessary maintenance. Cervical and vaginal ulceration.  Osteopenia.  Increased risk of fracture by FRAX model.   Oral HSV 1.   Plan: Mammogram screening discussed. Self breast awareness reviewed. Pap and HR HPV  not collected. Guidelines for Calcium, Vitamin D, regular exercise program including cardiovascular and weight bearing exercise. Keep pessary out for 2 weeks and return for recheck and pap/HR HPV collection.  Refill of Valtrex.  BMD scheduled for January, 2025.  Follow up annually and prn.   25 min  total time was spent for this patient encounter, including preparation, face-to-face counseling with the patient, coordination of care, and documentation of the encounter in addition to doing the breast and pelvic exam.

## 2023-02-15 DIAGNOSIS — L570 Actinic keratosis: Secondary | ICD-10-CM | POA: Diagnosis not present

## 2023-02-27 ENCOUNTER — Encounter: Payer: Self-pay | Admitting: Obstetrics and Gynecology

## 2023-02-27 ENCOUNTER — Ambulatory Visit (INDEPENDENT_AMBULATORY_CARE_PROVIDER_SITE_OTHER): Payer: Medicare PPO | Admitting: Obstetrics and Gynecology

## 2023-02-27 VITALS — BP 128/70 | HR 68 | Ht 64.5 in | Wt 127.0 lb

## 2023-02-27 DIAGNOSIS — Z4689 Encounter for fitting and adjustment of other specified devices: Secondary | ICD-10-CM

## 2023-02-27 DIAGNOSIS — Z9189 Other specified personal risk factors, not elsewhere classified: Secondary | ICD-10-CM | POA: Diagnosis not present

## 2023-02-27 DIAGNOSIS — N765 Ulceration of vagina: Secondary | ICD-10-CM | POA: Diagnosis not present

## 2023-02-27 DIAGNOSIS — N812 Incomplete uterovaginal prolapse: Secondary | ICD-10-CM | POA: Diagnosis not present

## 2023-02-27 DIAGNOSIS — M8580 Other specified disorders of bone density and structure, unspecified site: Secondary | ICD-10-CM

## 2023-02-27 DIAGNOSIS — Z01419 Encounter for gynecological examination (general) (routine) without abnormal findings: Secondary | ICD-10-CM

## 2023-02-27 MED ORDER — VALACYCLOVIR HCL 1 G PO TABS: ORAL_TABLET | ORAL | 1 refills | Status: DC

## 2023-02-27 NOTE — Patient Instructions (Addendum)
Calcium Content in Foods Calcium is the most abundant mineral in the body. Most of the body's calcium supply is stored in bones and teeth. Calcium helps many parts of the body function normally, including: Blood and blood vessels. Nerves. Hormones. Muscles. Bones and teeth. When your calcium stores are low, you may be at risk for low bone mass, bone loss, and broken bones (fractures). When you get enough calcium, it helps to support strong bones and teeth throughout your life. Calcium is especially important for: Children during growth spurts. Girls during adolescence. Women who are pregnant or breastfeeding. Women after their menstrual cycle stops (postmenopause). Women whose menstrual cycle has stopped due to anorexia nervosa or regular intense exercise. People who cannot eat or digest dairy products. Vegans. Recommended daily amounts of calcium: Women (ages 56 to 27): 1,000 mg per day. Women (ages 62 and older): 1,200 mg per day. Men (ages 21 to 67): 1,000 mg per day. Men (ages 40 and older): 1,200 mg per day. Women (ages 76 to 97): 1,300 mg per day. Men (ages 60 to 61): 1,300 mg per day. General information Eat foods that are high in calcium. Try to get most of your calcium from food. Some people may benefit from taking calcium supplements. Check with your health care provider or diet and nutrition specialist (dietitian) before starting any calcium supplements. Calcium supplements may interact with certain medicines. Too much calcium may cause other health problems, such as constipation and kidney stones. For the body to absorb calcium, it needs vitamin D. Sources of vitamin D include: Skin exposure to direct sunlight. Foods, such as egg yolks, liver, mushrooms, saltwater fish, and fortified milk. Vitamin D supplements. Check with your health care provider or dietitian before starting any vitamin D supplements. What foods are high in calcium?  Foods that are high in calcium contain  more than 100 milligrams per serving. Fruits Fortified orange juice or other fruit juice, 300 mg per 8 oz serving. Vegetables Collard greens, 360 mg per 8 oz serving. Kale, 100 mg per 8 oz serving. Bok choy, 160 mg per 8 oz serving. Grains Fortified ready-to-eat cereals, 100 to 1,000 mg per 8 oz serving. Fortified frozen waffles, 200 mg in 2 waffles. Oatmeal, 140 mg in 1 cup. Meats and other proteins Sardines, canned with bones, 325 mg per 3 oz serving. Salmon, canned with bones, 180 mg per 3 oz serving. Canned shrimp, 125 mg per 3 oz serving. Baked beans, 160 mg per 4 oz serving. Tofu, firm, made with calcium sulfate, 253 mg per 4 oz serving. Dairy Yogurt, plain, low-fat, 310 mg per 6 oz serving. Nonfat milk, 300 mg per 8 oz serving. American cheese, 195 mg per 1 oz serving. Cheddar cheese, 205 mg per 1 oz serving. Cottage cheese 2%, 105 mg per 4 oz serving. Fortified soy, rice, or almond milk, 300 mg per 8 oz serving. Mozzarella, part skim, 210 mg per 1 oz serving. The items listed above may not be a complete list of foods high in calcium. Actual amounts of calcium may be different depending on processing. Contact a dietitian for more information. What foods are lower in calcium? Foods that are lower in calcium contain 50 mg or less per serving. Fruits Apple, about 6 mg. Banana, about 12 mg. Vegetables Lettuce, 19 mg per 2 oz serving. Tomato, about 11 mg. Grains Rice, 4 mg per 6 oz serving. Boiled potatoes, 14 mg per 8 oz serving. White bread, 6 mg per slice. Meats and other proteins  Egg, 27 mg per 2 oz serving. Red meat, 7 mg per 4 oz serving. Chicken, 17 mg per 4 oz serving. Fish, cod, or trout, 20 mg per 4 oz serving. Dairy Cream cheese, regular, 14 mg per 1 Tbsp serving. Brie cheese, 50 mg per 1 oz serving. Parmesan cheese, 70 mg per 1 Tbsp serving. The items listed above may not be a complete list of foods lower in calcium. Actual amounts of calcium may be  different depending on processing. Contact a dietitian for more information. Summary Calcium is an important mineral in the body because it affects many functions. Getting enough calcium helps support strong bones and teeth throughout your life. Try to get most of your calcium from food. Calcium supplements may interact with certain medicines. Check with your health care provider or dietitian before starting any calcium supplements. This information is not intended to replace advice given to you by your health care provider. Make sure you discuss any questions you have with your health care provider. Document Revised: 09/03/2019 Document Reviewed: 09/03/2019 Elsevier Patient Education  2024 [employer] Inc.   EXERCISE AND DIET:  We recommended that you start or continue a regular exercise program for good health. Regular exercise means any activity that makes your heart beat faster and makes you sweat.  We recommend exercising at least 30 minutes per day at least 3 days a week, preferably 4 or 5.  We also recommend a diet low in fat and sugar.  Inactivity, poor dietary choices and obesity can cause diabetes, heart attack, stroke, and kidney damage, among others.    ALCOHOL AND SMOKING:  Women should limit their alcohol intake to no more than 7 drinks/beers/glasses of wine (combined, not each!) per week. Moderation of alcohol intake to this level decreases your risk of breast cancer and liver damage. And of course, no recreational drugs are part of a healthy lifestyle.  And absolutely no smoking or even second hand smoke. Most people know smoking can cause heart and lung diseases, but did you know it also contributes to weakening of your bones? Aging of your skin?  Yellowing of your teeth and nails?  CALCIUM AND VITAMIN D:  Adequate intake of calcium and Vitamin D are recommended.  The recommendations for exact amounts of these supplements seem to change often, but generally speaking 600 mg of calcium  (either carbonate or citrate) and 800 units of Vitamin D per day seems prudent. Certain women may benefit from higher intake of Vitamin D.  If you are among these women, your doctor will have told you during your visit.    PAP SMEARS:  Pap smears, to check for cervical cancer or precancers,  have traditionally been done yearly, although recent scientific advances have shown that most women can have pap smears less often.  However, every woman still should have a physical exam from her gynecologist every year. It will include a breast check, inspection of the vulva and vagina to check for abnormal growths or skin changes, a visual exam of the cervix, and then an exam to evaluate the size and shape of the uterus and ovaries.  And after 76 years of age, a rectal exam is indicated to check for rectal cancers. We will also provide age appropriate advice regarding health maintenance, like when you should have certain vaccines, screening for sexually transmitted diseases, bone density testing, colonoscopy, mammograms, etc.   MAMMOGRAMS:  All women over 63 years old should have a yearly mammogram. Many facilities now offer  a "3D" mammogram, which may cost around $50 extra out of pocket. If possible,  we recommend you accept the option to have the 3D mammogram performed.  It both reduces the number of women who will be called back for extra views which then turn out to be normal, and it is better than the routine mammogram at detecting truly abnormal areas.    COLONOSCOPY:  Colonoscopy to screen for colon cancer is recommended for all women at age 78.  We know, you hate the idea of the prep.  We agree, BUT, having colon cancer and not knowing it is worse!!  Colon cancer so often starts as a polyp that can be seen and removed at colonscopy, which can quite literally save your life!  And if your first colonoscopy is normal and you have no family history of colon cancer, most women don't have to have it again for 10  years.  Once every ten years, you can do something that may end up saving your life, right?  We will be happy to help you get it scheduled when you are ready.  Be sure to check your insurance coverage so you understand how much it will cost.  It may be covered as a preventative service at no cost, but you should check your particular policy.

## 2023-02-28 NOTE — Progress Notes (Signed)
GYNECOLOGY  VISIT   HPI: 77 y.o.   Married  Caucasian  female   G2P2002 with Patient's last menstrual period was 05/22/2000 (approximate).   here for   pessary recheck and pap.  She had vaginal cuff ulceration and inflammation noted at her exam on 02/27/23.  She uses a pessary, and I recommended she leave this out at that visit.   She had some red discharge after the pessary was removed at that visit.   She has discomfort with the pessary out for a long period of time.   Usually takes it out once a week for an overnight.   She has had a right mastectomy and does not use vaginal estrogens.  GYNECOLOGIC HISTORY: Patient's last menstrual period was 05/22/2000 (approximate). Contraception:  BTL/ PMP Menopausal hormone therapy:  n/a Last mammogram:  01/12/23 - BI-RADS1, cat B density, rt breast mastectomy  Last pap smear:   02/21/21 neg, 02/13/19 neg        OB History     Gravida  2   Para  2   Term  2   Preterm      AB      Living  2      SAB      IAB      Ectopic      Multiple      Live Births                 Patient Active Problem List   Diagnosis Date Noted   Osteopenia 02/22/2022   Family history of brain cancer 10/04/2012   Breast cancer (HCC) 10/04/2012    Past Medical History:  Diagnosis Date   Allergy    Breast cancer (HCC)    right DCIS at age 34   Cystocele with prolapse    History of fractured kneecap    2022   HSV-1 infection    Hypertension    Menopause 1998   Osteopenia 2017   FRAX - increased risk of fracture    Past Surgical History:  Procedure Laterality Date   AUGMENTATION MAMMAPLASTY Right 09/2008   right breast reconstruction   BREAST BIOPSY Left 2017   MASTECTOMY Right 05/2008   DCIS Multicentric--Mastectomy  (neg) nodes---no tamox   MOHS SURGERY  05/22/2020   on nose for basal cell   TUBAL LIGATION     BTSP    Current Outpatient Medications  Medication Sig Dispense Refill   cetirizine (ZYRTEC) 10 MG tablet Take  by mouth.     docusate sodium (COLACE) 100 MG capsule Take 100 mg by mouth 2 (two) times daily.     fluticasone (FLONASE) 50 MCG/ACT nasal spray Place into both nostrils as needed for allergies or rhinitis.     hydrochlorothiazide (MICROZIDE) 12.5 MG capsule daily.      Multiple Vitamin (MULTIVITAMIN) tablet Take 1 tablet by mouth daily.     NIACINAMIDE PO Take by mouth.     ramipril (ALTACE) 10 MG tablet Take 10 mg by mouth daily.     valACYclovir (VALTREX) 1000 MG tablet Take 2 tablets (2000 mg) by mouth twice daily for 24 hours as needed for cold sores. 30 tablet 1   VITAMIN D PO Take 2,000 Int'l Units by mouth. Vitamin d3     No current facility-administered medications for this visit.     ALLERGIES: Tetracyclines & related  Family History  Problem Relation Age of Onset   Osteoporosis Mother    Heart disease Father  died of heart attack age 79   Osteoporosis Maternal Aunt    Breast cancer Maternal Grandmother 27   Breast cancer Cousin 67       maternal 1st cousin    Social History   Socioeconomic History   Marital status: Married    Spouse name: Not on file   Number of children: Not on file   Years of education: Not on file   Highest education level: Not on file  Occupational History   Occupation: retired    Associate Professor: [employer]  Tobacco Use   Smoking status: Never   Smokeless tobacco: Never  Vaping Use   Vaping status: Never Used  Substance and Sexual Activity   Alcohol use: No    Alcohol/week: 0.0 standard drinks of alcohol   Drug use: No   Sexual activity: Not Currently    Partners: Male    Birth control/protection: Post-menopausal, Surgical    Comment: Tubal, older than 16, less than 5  Other Topics Concern   Not on file  Social History Narrative   Not on file   Social Determinants of Health   Financial Resource Strain: Not on file  Food Insecurity: No Food Insecurity (12/22/2021)   Hunger Vital Sign    Worried About Running Out of Food  in the Last Year: Never true    Ran Out of Food in the Last Year: Never true  Transportation Needs: No Transportation Needs (12/22/2021)   PRAPARE - Administrator, Civil Service (Medical): No    Lack of Transportation (Non-Medical): No  Physical Activity: Not on file  Stress: Not on file  Social Connections: Not on file  Intimate Partner Violence: Not At Risk (12/22/2021)   Humiliation, Afraid, Rape, and Kick questionnaire    Fear of Current or Ex-Partner: No    Emotionally Abused: No    Physically Abused: No    Sexually Abused: No    Review of Systems  All other systems reviewed and are negative.   PHYSICAL EXAMINATION:    BP 116/78 (BP Location: Left Arm, Patient Position: Sitting, Cuff Size: Normal)   Pulse 69   Ht 5' 4.5" (1.638 m)   Wt 127 lb (57.6 kg)   LMP 05/22/2000 (Approximate) Comment: BTL  SpO2 96%   BMI 21.46 kg/m     General appearance: alert, cooperative and appears stated age   Pelvic: External genitalia:  no lesions              Urethra:  normal appearing urethra with no masses, tenderness or lesions              Bartholins and Skenes: normal                 Vagina: normal appearing vagina with normal color and discharge, no lesions              Cervix: no lesions.  Granulation tissue of the left and right vaginal apex, appox 1.5 cm on left and 5 mm on right.  Both areas treated with silver nitrate today.                 Bimanual Exam:  Uterus:  normal size, contour, position, consistency, mobility, non-tender              Adnexa: no mass, fullness, tenderness            Ring pessary with support replaced.   Chaperone was present for exam:  Warren Lacy,  CMA  ASSESSMENT  Vaginal granulation tissue.  Pessary maintenance.  Cervical cancer screening.  Hx breast cancer.  Not an estrogen candidate.  PLAN  Ok to continue the pessary use.  We discussed Replens lubricant.  Vaginal vitamin E may be an option also.  We reviewed possible fitting of a  small pessary if inflammation from the pessary persists.   Pap and HR HPV testing.  FU in 4 - 6 weeks.     20  total time was spent for this patient encounter, including preparation, face-to-face counseling with the patient, coordination of care, and documentation of the encounter.

## 2023-03-14 ENCOUNTER — Ambulatory Visit: Payer: Medicare PPO | Admitting: Obstetrics and Gynecology

## 2023-03-14 ENCOUNTER — Encounter: Payer: Self-pay | Admitting: Obstetrics and Gynecology

## 2023-03-14 VITALS — BP 116/78 | HR 69 | Ht 64.5 in | Wt 127.0 lb

## 2023-03-14 DIAGNOSIS — Z124 Encounter for screening for malignant neoplasm of cervix: Secondary | ICD-10-CM | POA: Diagnosis not present

## 2023-03-14 DIAGNOSIS — Z4689 Encounter for fitting and adjustment of other specified devices: Secondary | ICD-10-CM

## 2023-03-14 DIAGNOSIS — N898 Other specified noninflammatory disorders of vagina: Secondary | ICD-10-CM | POA: Diagnosis not present

## 2023-03-16 DIAGNOSIS — Z23 Encounter for immunization: Secondary | ICD-10-CM | POA: Diagnosis not present

## 2023-03-16 DIAGNOSIS — Z9181 History of falling: Secondary | ICD-10-CM | POA: Diagnosis not present

## 2023-03-16 DIAGNOSIS — I10 Essential (primary) hypertension: Secondary | ICD-10-CM | POA: Diagnosis not present

## 2023-03-16 DIAGNOSIS — E559 Vitamin D deficiency, unspecified: Secondary | ICD-10-CM | POA: Diagnosis not present

## 2023-03-16 DIAGNOSIS — Z0000 Encounter for general adult medical examination without abnormal findings: Secondary | ICD-10-CM | POA: Diagnosis not present

## 2023-03-16 DIAGNOSIS — E7800 Pure hypercholesterolemia, unspecified: Secondary | ICD-10-CM | POA: Diagnosis not present

## 2023-03-16 DIAGNOSIS — Z860100 Personal history of colon polyps, unspecified: Secondary | ICD-10-CM | POA: Diagnosis not present

## 2023-03-16 DIAGNOSIS — Z79899 Other long term (current) drug therapy: Secondary | ICD-10-CM | POA: Diagnosis not present

## 2023-03-21 LAB — CYTOLOGY - PAP
Adequacy: ABNORMAL
Comment: NEGATIVE

## 2023-04-17 NOTE — Progress Notes (Unsigned)
GYNECOLOGY  VISIT   HPI: 77 y.o.   Married  Caucasian female   G2P2002 with Patient's last menstrual period was 05/22/2000 (approximate).   here for: return in 4-6 week for pessary check. Pt put pessary back in and took it out 11/8 due to noticing some spotting. Pt put it back in 11/28 but noticed spotting again.  Has left it out since then.    Has a #2 ring with support pessary.   No further bleeding.   Using [employer].  Had also tried Replens but recently.   Feeling heaviness in pelvis without the pessary in place.   GYNECOLOGIC HISTORY: Patient's last menstrual period was 05/22/2000 (approximate). Contraception:  PMP Menopausal hormone therapy:  n/a Last 2 paps:  03/14/23 non-diagnostic, 02/21/21 neg History of abnormal Pap or positive HPV:  no Mammogram:  01/12/23 - BI-RADS1, cat B density, rt breast mastectomy         OB History     Gravida  2   Para  2   Term  2   Preterm      AB      Living  2      SAB      IAB      Ectopic      Multiple      Live Births                 Patient Active Problem List   Diagnosis Date Noted   Osteopenia 02/22/2022   Family history of brain cancer 10/04/2012   Breast cancer (HCC) 10/04/2012    Past Medical History:  Diagnosis Date   Allergy    Breast cancer (HCC)    right DCIS at age 52   Cystocele with prolapse    History of fractured kneecap    2022   HSV-1 infection    Hypertension    Menopause 1998   Osteopenia 2017   FRAX - increased risk of fracture    Past Surgical History:  Procedure Laterality Date   AUGMENTATION MAMMAPLASTY Right 09/2008   right breast reconstruction   BREAST BIOPSY Left 2017   MASTECTOMY Right 05/2008   DCIS Multicentric--Mastectomy  (neg) nodes---no tamox   MOHS SURGERY  05/22/2020   on nose for basal cell   TUBAL LIGATION     BTSP    Current Outpatient Medications  Medication Sig Dispense Refill   cetirizine (ZYRTEC) 10 MG tablet Take by mouth.     docusate  sodium (COLACE) 100 MG capsule Take 100 mg by mouth 2 (two) times daily.     fluticasone (FLONASE) 50 MCG/ACT nasal spray Place into both nostrils as needed for allergies or rhinitis.     hydrochlorothiazide (MICROZIDE) 12.5 MG capsule daily.      Multiple Vitamin (MULTIVITAMIN) tablet Take 1 tablet by mouth daily.     NIACINAMIDE PO Take by mouth.     ramipril (ALTACE) 10 MG tablet Take 10 mg by mouth daily.     valACYclovir (VALTREX) 1000 MG tablet Take 2 tablets (2000 mg) by mouth twice daily for 24 hours as needed for cold sores. 30 tablet 1   VITAMIN D PO Take 2,000 Int'l Units by mouth. Vitamin d3     No current facility-administered medications for this visit.     ALLERGIES: Tetracyclines & related  Family History  Problem Relation Age of Onset   Osteoporosis Mother    Heart disease Father        died of  heart attack age 61   Osteoporosis Maternal Aunt    Breast cancer Maternal Grandmother 26   Breast cancer Cousin 73       maternal 1st cousin    Social History   Socioeconomic History   Marital status: Married    Spouse name: Not on file   Number of children: Not on file   Years of education: Not on file   Highest education level: Not on file  Occupational History   Occupation: retired    Associate Professor: [employer]  Tobacco Use   Smoking status: Never   Smokeless tobacco: Never  Vaping Use   Vaping status: Never Used  Substance and Sexual Activity   Alcohol use: No    Alcohol/week: 0.0 standard drinks of alcohol   Drug use: No   Sexual activity: Not Currently    Partners: Male    Birth control/protection: Post-menopausal, Surgical    Comment: Tubal, older than 16, less than 5  Other Topics Concern   Not on file  Social History Narrative   Not on file   Social Determinants of Health   Financial Resource Strain: Not on file  Food Insecurity: No Food Insecurity (12/22/2021)   Hunger Vital Sign    Worried About Running Out of Food in the Last Year: Never  true    Ran Out of Food in the Last Year: Never true  Transportation Needs: No Transportation Needs (12/22/2021)   PRAPARE - Administrator, Civil Service (Medical): No    Lack of Transportation (Non-Medical): No  Physical Activity: Not on file  Stress: Not on file  Social Connections: Not on file  Intimate Partner Violence: Not At Risk (12/22/2021)   Humiliation, Afraid, Rape, and Kick questionnaire    Fear of Current or Ex-Partner: No    Emotionally Abused: No    Physically Abused: No    Sexually Abused: No    Review of Systems  All other systems reviewed and are negative.   PHYSICAL EXAMINATION:   Ht 5' 4.5" (1.638 m)   Wt 127 lb (57.6 kg)   LMP 05/22/2000 (Approximate) Comment: BTL  BMI 21.46 kg/m     General appearance: alert, cooperative and appears stated age Head: Normocephalic, without obvious abnormality, atraumatic Neck: no adenopathy, supple, symmetrical, trachea midline and thyroid normal to inspection and palpation Lungs: clear to auscultation bilaterally Breasts: normal appearance, no masses or tenderness, No nipple retraction or dimpling, No nipple discharge or bleeding, No axillary or supraclavicular adenopathy Heart: regular rate and rhythm Abdomen: soft, non-tender, no masses,  no organomegaly Extremities: extremities normal, atraumatic, no cyanosis or edema Skin: Skin color, texture, turgor normal. No rashes or lesions Lymph nodes: Cervical, supraclavicular, and axillary nodes normal. No abnormal inguinal nodes palpated Neurologic: Grossly normal  Pelvic: External genitalia:  no lesions              Urethra:  normal appearing urethra with no masses, tenderness or lesions              Bartholins and Skenes: normal                 Vagina: normal appearing vagina with normal color and discharge, right vaginal wall with ulceration and slight bleeding.               Cervix: no lesions                Chaperone was present for exam:  Dorrene German.,  CMA  ASSESSMENT:  Incomplete uterovaginal prolapse.  Vaginal ulceration.  Cervical cancer screening.   PLAN:  Declines a #1 pessary ring with support.  She remembers a really small pessary falling out with a fitting years ago.  Compounded vaginal E suppositories to gate [employer].   We briefly discussed surgical care for prolapse.   FU in 6 - 8 weeks.   ***  total time was spent for this patient encounter, including preparation, face-to-face counseling with the patient, coordination of care, and documentation of the encounter.

## 2023-04-23 ENCOUNTER — Ambulatory Visit: Payer: Medicare PPO | Admitting: Obstetrics and Gynecology

## 2023-05-01 ENCOUNTER — Encounter: Payer: Self-pay | Admitting: Obstetrics and Gynecology

## 2023-05-01 ENCOUNTER — Other Ambulatory Visit (HOSPITAL_COMMUNITY)
Admission: RE | Admit: 2023-05-01 | Discharge: 2023-05-01 | Disposition: A | Discharge status: Home / Self Care | Payer: Medicare PPO | Source: Ambulatory Visit | Attending: Obstetrics and Gynecology | Admitting: Obstetrics and Gynecology

## 2023-05-01 ENCOUNTER — Ambulatory Visit: Payer: Medicare PPO | Admitting: Obstetrics and Gynecology

## 2023-05-01 VITALS — BP 134/72 | HR 86 | Ht 64.5 in | Wt 127.0 lb

## 2023-05-01 DIAGNOSIS — N952 Postmenopausal atrophic vaginitis: Secondary | ICD-10-CM

## 2023-05-01 DIAGNOSIS — N812 Incomplete uterovaginal prolapse: Secondary | ICD-10-CM | POA: Diagnosis not present

## 2023-05-01 DIAGNOSIS — Z1151 Encounter for screening for human papillomavirus (HPV): Secondary | ICD-10-CM | POA: Diagnosis not present

## 2023-05-01 DIAGNOSIS — Z4689 Encounter for fitting and adjustment of other specified devices: Secondary | ICD-10-CM | POA: Diagnosis not present

## 2023-05-01 DIAGNOSIS — Z124 Encounter for screening for malignant neoplasm of cervix: Secondary | ICD-10-CM

## 2023-05-01 DIAGNOSIS — N765 Ulceration of vagina: Secondary | ICD-10-CM | POA: Diagnosis not present

## 2023-05-01 DIAGNOSIS — Z01419 Encounter for gynecological examination (general) (routine) without abnormal findings: Secondary | ICD-10-CM | POA: Insufficient documentation

## 2023-05-01 MED ORDER — NONFORMULARY OR COMPOUNDED ITEM: 2 refills | Status: DC

## 2023-05-03 LAB — CYTOLOGY - PAP
Comment: NEGATIVE
Diagnosis: UNDETERMINED — AB
High risk HPV: NEGATIVE

## 2023-05-08 DIAGNOSIS — I959 Hypotension, unspecified: Secondary | ICD-10-CM | POA: Diagnosis not present

## 2023-05-08 DIAGNOSIS — U071 COVID-19: Secondary | ICD-10-CM | POA: Diagnosis not present

## 2023-05-31 ENCOUNTER — Ambulatory Visit
Admission: RE | Admit: 2023-05-31 | Discharge: 2023-05-31 | Disposition: A | Discharge status: Home / Self Care | Payer: Medicare PPO | Source: Ambulatory Visit | Attending: Obstetrics and Gynecology | Admitting: Obstetrics and Gynecology

## 2023-05-31 DIAGNOSIS — Z853 Personal history of malignant neoplasm of breast: Secondary | ICD-10-CM | POA: Diagnosis not present

## 2023-05-31 DIAGNOSIS — N958 Other specified menopausal and perimenopausal disorders: Secondary | ICD-10-CM | POA: Diagnosis not present

## 2023-05-31 DIAGNOSIS — M8588 Other specified disorders of bone density and structure, other site: Secondary | ICD-10-CM | POA: Diagnosis not present

## 2023-05-31 DIAGNOSIS — E2839 Other primary ovarian failure: Secondary | ICD-10-CM | POA: Diagnosis not present

## 2023-05-31 DIAGNOSIS — Z780 Asymptomatic menopausal state: Secondary | ICD-10-CM

## 2023-07-09 NOTE — Progress Notes (Signed)
 GYNECOLOGY  VISIT   HPI: 78 y.o.   Married  Caucasian female   G2P2002 with Patient's last menstrual period was 05/22/2000 (approximate).   here for: 2 mo pessary check and discuss dexa.    No bleeding, pain or discharge.  Not using pessary fro 3 months.   Feels some pelvic pressure, which affects her activity.  Would like to use pessary again.   Emptying her bladder ok.  Has some hesitancy to void.   Not using vag vit E suppositories.  Takes vit D daily and takes Tums.   Study Result  Narrative & Impression  EXAM: DUAL X-RAY ABSORPTIOMETRY (DXA) FOR BONE MINERAL DENSITY   IMPRESSION: Referring Physician:  Patton Salles Your patient completed a bone mineral density test using GE Lunar iDXA system (analysis version: 16). Technologist: BEC PATIENT: Name: Kendra Velazquez, Kendra Velazquez Patient ID: [id number] Birth Date: 1945/08/16 Height: 64.5 in. Sex: Female Measured: 05/31/2023 Weight: 133.4 lbs. Indications: Advanced Age, Breast Cancer History, Caucasian, Estrogen Deficient, Family Hist. (Parent hip fracture), Family History of Osteoporosis, History of Fracture (Adult) (V15.51), History of Osteopenia, Low Calcium Intake, Multiple broken bones, Postmenopausal, Tums, Vitamin D Deficient Fractures: Right Hand, Right Patella Treatments: Multivitamin, Vitamin D (E933.5)   ASSESSMENT: The BMD measured at Femur Total Right is 0.759 g/cm2 with a T-score of -2.0. This patient is considered osteopenic/low bone mass according to World Health Organization [facility]) criteria.   L3 & L4 were excluded due to degenerative changes. The quality of the exam is good.   Site Region Measured Date Measured Age YA BMD Significant CHANGE T-score DualFemur Total Right 05/31/2023    [age]         -2.0    0.759 g/cm2 DualFemur Total Right 12/09/2019    [age]         -1.9    0.766 g/cm2   AP Spine  L1-L2       05/31/2023    [age]         -1.1    1.045 g/cm2 AP Spine  L1-L2       12/09/2019     [age]         -1.2    1.026 g/cm2   DualFemur Total Mean  05/31/2023    [age]         -1.8    0.783 g/cm2 DualFemur Total Mean  12/09/2019    [age]         -1.7    0.794 g/cm2   World Health Organization [facility]) criteria for post-menopausal, Caucasian Women: Normal       T-score at or above -1 SD Osteopenia   T-score between -1 and -2.5 SD Osteoporosis T-score at or below -2.5 SD   RECOMMENDATION: 1. All patients should optimize calcium and vitamin D intake. 2. Consider FDA-approved medical therapies in postmenopausal women and men aged 72 years and older, based on the following: a. A hip or vertebral (clinical or morphometric) fracture. b. T-score = -2.5 at the femoral neck or spine after appropriate evaluation to exclude secondary causes. c. Low bone mass (T-score between -1.0 and -2.5 at the femoral neck or spine) and a 10-year probability of a hip fracture = 3% or a 10-year probability of a major osteoporosis-related fracture = 20% based on the [place]-adapted WHO algorithm. d. Clinician judgment and/or patient preferences may indicate treatment for people with 10-year fracture probabilities above or below these levels.   FOLLOW-UP: Patients with diagnosis of osteoporosis or at high risk  for fracture should have regular bone mineral density tests. Patients eligible for Medicare are allowed routine testing every 2 years. The testing frequency can be increased to one year for patients who have rapidly progressing disease, are receiving or discontinuing medical therapy to restore bone mass, or have additional risk factors.   I have reviewed this study and agree with the findings. [facility]   FRAX* 10-year Probability of Fracture Based on femoral neck BMD: DualFemur (Right)   Major Osteoporotic Fracture: 31.7% Hip Fracture:                18.5% Population:                  [place] (Caucasian) Risk Factors: Family Hist. (Parent hip fracture), History of Fracture (Adult)  (V15.51)   *FRAX is a Armed forces logistics/support/administrative officer of the [employer] of [employer] for Metabolic Bone Disease, a World Science writer (WHO) [employer].   ASSESSMENT: The probability of a major osteoporotic fracture is 31.7% within the next ten years. The probability of a hip fracture is 18.5% within the next ten years.     Electronically Signed   By: Frederico Hamman M.D.   On: 05/31/2023 15:40     Loves plants.   GYNECOLOGIC HISTORY: Patient's last menstrual period was 05/22/2000 (approximate). Contraception:  PMP Menopausal hormone therapy:  n/a Last 2 paps:  05/01/23 ASCUS: HR HPV neg, 02/21/21 neg  History of abnormal Pap or positive HPV:  yes Mammogram:  01/12/23 - BI-RADS1, cat B density, rt breast mastectomy         OB History     Gravida  2   Para  2   Term  2   Preterm      AB      Living  2      SAB      IAB      Ectopic      Multiple      Live Births                 Patient Active Problem List   Diagnosis Date Noted   Osteopenia 02/22/2022   Family history of brain cancer 10/04/2012   Breast cancer (HCC) 10/04/2012    Past Medical History:  Diagnosis Date   Allergy    Breast cancer (HCC)    right DCIS at age 65   Cystocele with prolapse    History of fractured kneecap    2022   HSV-1 infection    Hypertension    Menopause 1998   Osteopenia 2017   FRAX - increased risk of fracture    Past Surgical History:  Procedure Laterality Date   AUGMENTATION MAMMAPLASTY Right 09/2008   right breast reconstruction   BREAST BIOPSY Left 2017   MASTECTOMY Right 05/2008   DCIS Multicentric--Mastectomy  (neg) nodes---no tamox   MOHS SURGERY  05/22/2020   on nose for basal cell   TUBAL LIGATION     BTSP    Current Outpatient Medications  Medication Sig Dispense Refill   cetirizine (ZYRTEC) 10 MG tablet Take by mouth.     docusate sodium (COLACE) 100 MG capsule Take 100 mg by mouth 2 (two) times daily.      hydrochlorothiazide (MICROZIDE) 12.5 MG capsule daily.      Multiple Vitamin (MULTIVITAMIN) tablet Take 1 tablet by mouth daily.     NIACINAMIDE PO Take by mouth.     ramipril (ALTACE) 10 MG tablet Take 10  mg by mouth daily.     valACYclovir (VALTREX) 1000 MG tablet Take 2 tablets (2000 mg) by mouth twice daily for 24 hours as needed for cold sores. 30 tablet 1   VITAMIN D PO Take 2,000 Int'l Units by mouth. Vitamin d3     fluticasone (FLONASE) 50 MCG/ACT nasal spray Place into both nostrils as needed for allergies or rhinitis.     NONFORMULARY OR COMPOUNDED ITEM Vaginal vitamin E suppository 200 international units, place one per vagina at bedtime twice a week.  Dispense:  8.  Refill:  2.  [employer]. (Patient not taking: Reported on 07/23/2023) 8 each 2   No current facility-administered medications for this visit.     ALLERGIES: Tetracyclines & related  Family History  Problem Relation Age of Onset   Osteoporosis Mother    Heart disease Father        died of heart attack age 10   Osteoporosis Maternal Aunt    Breast cancer Maternal Grandmother 59   Breast cancer Cousin 63       maternal 1st cousin    Social History   Socioeconomic History   Marital status: Married    Spouse name: Not on file   Number of children: Not on file   Years of education: Not on file   Highest education level: Not on file  Occupational History   Occupation: retired    Associate Professor: [employer]  Tobacco Use   Smoking status: Never   Smokeless tobacco: Never  Vaping Use   Vaping status: Never Used  Substance and Sexual Activity   Alcohol use: No    Alcohol/week: 0.0 standard drinks of alcohol   Drug use: No   Sexual activity: Not Currently    Partners: Male    Birth control/protection: Post-menopausal, Surgical    Comment: Tubal, older than 16, less than 5  Other Topics Concern   Not on file  Social History Narrative   Not on file   Social Drivers of Health   Financial  Resource Strain: Not on file  Food Insecurity: No Food Insecurity (12/22/2021)   Hunger Vital Sign    Worried About Running Out of Food in the Last Year: Never true    Ran Out of Food in the Last Year: Never true  Transportation Needs: No Transportation Needs (12/22/2021)   PRAPARE - Administrator, Civil Service (Medical): No    Lack of Transportation (Non-Medical): No  Physical Activity: Not on file  Stress: Not on file  Social Connections: Not on file  Intimate Partner Violence: Not At Risk (12/22/2021)   Humiliation, Afraid, Rape, and Kick questionnaire    Fear of Current or Ex-Partner: No    Emotionally Abused: No    Physically Abused: No    Sexually Abused: No    Review of Systems  All other systems reviewed and are negative.   PHYSICAL EXAMINATION:   BP (!) 148/82 (BP Location: Left Arm, Patient Position: Sitting, Cuff Size: Normal)   Pulse 80   Wt 130 lb (59 kg)   LMP 05/22/2000 (Approximate) Comment: BTL  SpO2 99%   BMI 21.97 kg/m     General appearance: alert, cooperative and appears stated age  Pelvic: External genitalia:  no lesions              Urethra:  normal appearing urethra with no masses, tenderness or lesions              Bartholins  and Skenes: normal                 Vagina: normal appearing vagina with normal color and discharge, no lesions              Cervix: no lesions                Bimanual Exam:  Uterus:  normal size, contour, position, consistency, mobility, non-tender              Adnexa: no mass, fullness, tenderness        Chaperone was present for exam:  Warren Lacy, CMA  ASSESSMENT:  Pessary maintenance.  Vaginal ulceration.  Resolved.  Vaginal atrophy.  Hx breast cancer.  Osteopenia with increased risk of fracture.   PLAN:  Ring pessary with support placed.  Rx for vaginal vit E suppositories to [facility].    Patient declines prescription tx for osteopenia.  Next BMD in 2 years.  FU in 6 months for breast and  pelvic exam and follow up.  25 min  total time was spent for this patient encounter, including preparation, face-to-face counseling with the patient, coordination of care, and documentation of the encounter.

## 2023-07-23 ENCOUNTER — Ambulatory Visit: Payer: Medicare PPO | Admitting: Obstetrics and Gynecology

## 2023-07-23 ENCOUNTER — Encounter: Payer: Self-pay | Admitting: Obstetrics and Gynecology

## 2023-07-23 ENCOUNTER — Telehealth: Payer: Self-pay

## 2023-07-23 VITALS — BP 148/82 | HR 80 | Wt 130.0 lb

## 2023-07-23 DIAGNOSIS — Z4689 Encounter for fitting and adjustment of other specified devices: Secondary | ICD-10-CM

## 2023-07-23 DIAGNOSIS — N952 Postmenopausal atrophic vaginitis: Secondary | ICD-10-CM | POA: Diagnosis not present

## 2023-07-23 DIAGNOSIS — M8589 Other specified disorders of bone density and structure, multiple sites: Secondary | ICD-10-CM | POA: Diagnosis not present

## 2023-07-23 MED ORDER — NONFORMULARY OR COMPOUNDED ITEM: 6 refills | Status: DC

## 2023-07-23 NOTE — Telephone Encounter (Signed)
 Vitamin E vaginal suppository called to [facility]. Patient is aware.

## 2023-09-12 DIAGNOSIS — D225 Melanocytic nevi of trunk: Secondary | ICD-10-CM | POA: Diagnosis not present

## 2023-09-12 DIAGNOSIS — L570 Actinic keratosis: Secondary | ICD-10-CM | POA: Diagnosis not present

## 2023-09-12 DIAGNOSIS — L814 Other melanin hyperpigmentation: Secondary | ICD-10-CM | POA: Diagnosis not present

## 2023-09-12 DIAGNOSIS — Z08 Encounter for follow-up examination after completed treatment for malignant neoplasm: Secondary | ICD-10-CM | POA: Diagnosis not present

## 2023-09-12 DIAGNOSIS — L821 Other seborrheic keratosis: Secondary | ICD-10-CM | POA: Diagnosis not present

## 2023-09-12 DIAGNOSIS — Z85828 Personal history of other malignant neoplasm of skin: Secondary | ICD-10-CM | POA: Diagnosis not present

## 2023-09-12 DIAGNOSIS — D044 Carcinoma in situ of skin of scalp and neck: Secondary | ICD-10-CM | POA: Diagnosis not present

## 2023-10-01 DIAGNOSIS — R5383 Other fatigue: Secondary | ICD-10-CM | POA: Diagnosis not present

## 2023-10-01 DIAGNOSIS — Z03818 Encounter for observation for suspected exposure to other biological agents ruled out: Secondary | ICD-10-CM | POA: Diagnosis not present

## 2023-10-01 DIAGNOSIS — R6883 Chills (without fever): Secondary | ICD-10-CM | POA: Diagnosis not present

## 2023-10-01 DIAGNOSIS — R053 Chronic cough: Secondary | ICD-10-CM | POA: Diagnosis not present

## 2023-11-13 ENCOUNTER — Telehealth: Payer: Self-pay

## 2023-11-13 NOTE — Telephone Encounter (Signed)
 [facility] called & said pt wanted to get her vit e vaginal suppository there & needed a rx. It was previously sent to [facility]. I gave them the okay for #8 with 2 refills.

## 2023-11-14 ENCOUNTER — Telehealth: Payer: Self-pay

## 2023-11-14 NOTE — Telephone Encounter (Signed)
 Patient called & stated that she was having some bleeding with her pessary so she took it out. Dr. Nikki told her to schedule an appt to come in if this happens. Per appt desk there are no appointments. Routing to triage.

## 2023-11-14 NOTE — Telephone Encounter (Signed)
 Spoke with patient. Reports spotting with pessary, Hx of vaginal ulcer. Patient removed pessary on 6/24. Leaving out of town tomorrow. Denies bleeding today. Denies vaginal discharge or odor. RX for vit E suppositories sent on 11/13/23 to [facility], previously sent to [facility].   Offered work in appt today, patient declined. Scheduled for work in appt on 11/20/23 at 0915 with Dr. Nikki. Patient will keep pessary out until OV, will hold off on restarting Vit E until OV. Patient aware to call if any changes. Our office will call if any additional recommendations.   Routing to Dr. Nikki for final review.

## 2023-11-14 NOTE — Telephone Encounter (Signed)
 I agree with your recommendations.  I will see her on 11/20/23.

## 2023-11-19 ENCOUNTER — Encounter: Payer: Self-pay | Admitting: Obstetrics and Gynecology

## 2023-11-19 ENCOUNTER — Ambulatory Visit: Admitting: Obstetrics and Gynecology

## 2023-11-19 VITALS — BP 128/78 | HR 64

## 2023-11-19 DIAGNOSIS — N765 Ulceration of vagina: Secondary | ICD-10-CM

## 2023-11-19 DIAGNOSIS — Z4689 Encounter for fitting and adjustment of other specified devices: Secondary | ICD-10-CM | POA: Diagnosis not present

## 2023-11-19 MED ORDER — NONFORMULARY OR COMPOUNDED ITEM: 5 refills | Status: DC

## 2023-11-19 NOTE — Progress Notes (Signed)
 GYNECOLOGY  VISIT   HPI: 78 y.o.   Married  Caucasian female   G2P2002 with Patient's last menstrual period was 05/22/2000 (approximate).   here for: Bleeding with pessary- stopped using the vitamin E suppositories because she ran out, then started bleeding. Enough to wear a pad, blood light pink in color. Has taken pessary out stopped bleeding.  No pain.   After two missed dosages of vaginal vit E, she had vaginal bleeding.   Usually, she leaves her pessary out overnight when she uses the vit E suppository.      GYNECOLOGIC HISTORY: Patient's last menstrual period was 05/22/2000 (approximate). Contraception:  Tubal  Menopausal hormone therapy:  n/a Last 2 paps:  05/01/23 ASCUS HR HPV neg, 02/21/21 neg  History of abnormal Pap or positive HPV:  yes Mammogram:  01/12/23 Breast Density Cat B, BIRADS Cat 1 neg         OB History     Gravida  2   Para  2   Term  2   Preterm      AB      Living  2      SAB      IAB      Ectopic      Multiple      Live Births                 Patient Active Problem List   Diagnosis Date Noted   Osteopenia 02/22/2022   Family history of brain cancer 10/04/2012   Breast cancer (HCC) 10/04/2012    Past Medical History:  Diagnosis Date   Allergy    Breast cancer (HCC)    right DCIS at age 15   Cystocele with prolapse    History of fractured kneecap    2022   HSV-1 infection    Hypertension    Menopause 1998   Osteopenia 2017   FRAX - increased risk of fracture    Past Surgical History:  Procedure Laterality Date   AUGMENTATION MAMMAPLASTY Right 09/2008   right breast reconstruction   BREAST BIOPSY Left 2017   MASTECTOMY Right 05/2008   DCIS Multicentric--Mastectomy  (neg) nodes---no tamox   MOHS SURGERY  05/22/2020   on nose for basal cell   TUBAL LIGATION     BTSP    Current Outpatient Medications  Medication Sig Dispense Refill   cetirizine (ZYRTEC) 10 MG tablet Take by mouth.     docusate sodium  (COLACE) 100 MG capsule Take 100 mg by mouth 2 (two) times daily.     fluticasone (FLONASE) 50 MCG/ACT nasal spray Place into both nostrils as needed for allergies or rhinitis.     hydrochlorothiazide (MICROZIDE) 12.5 MG capsule daily.      Multiple Vitamin (MULTIVITAMIN) tablet Take 1 tablet by mouth daily.     NONFORMULARY OR COMPOUNDED ITEM Vaginal vitamin E suppository 200 international units, place one per vagina at bedtime twice a week.  Dispense:  8.  Refill:  2.  [employer]. 8 each 6   ramipril (ALTACE) 10 MG tablet Take 10 mg by mouth daily.     valACYclovir  (VALTREX ) 1000 MG tablet Take 2 tablets (2000 mg) by mouth twice daily for 24 hours as needed for cold sores. 30 tablet 1   VITAMIN D PO Take 2,000 Int'l Units by mouth. Vitamin d3     NIACINAMIDE PO Take by mouth. (Patient not taking: Reported on 11/19/2023)     No current facility-administered medications  for this visit.     ALLERGIES: Tetracyclines & related  Family History  Problem Relation Age of Onset   Osteoporosis Mother    Heart disease Father        died of heart attack age 80   Osteoporosis Maternal Aunt    Breast cancer Maternal Grandmother 61   Breast cancer Cousin 19       maternal 1st cousin    Social History   Socioeconomic History   Marital status: Married    Spouse name: Not on file   Number of children: Not on file   Years of education: Not on file   Highest education level: Not on file  Occupational History   Occupation: retired    Associate Professor: [employer]  Tobacco Use   Smoking status: Never   Smokeless tobacco: Never  Vaping Use   Vaping status: Never Used  Substance and Sexual Activity   Alcohol use: No    Alcohol/week: 0.0 standard drinks of alcohol   Drug use: No   Sexual activity: Not Currently    Partners: Male    Birth control/protection: Post-menopausal, Surgical    Comment: Tubal, older than 16, less than 5  Other Topics Concern   Not on file  Social History  Narrative   Not on file   Social Drivers of Health   Financial Resource Strain: Not on file  Food Insecurity: No Food Insecurity (12/22/2021)   Hunger Vital Sign    Worried About Running Out of Food in the Last Year: Never true    Ran Out of Food in the Last Year: Never true  Transportation Needs: No Transportation Needs (12/22/2021)   PRAPARE - Administrator, Civil Service (Medical): No    Lack of Transportation (Non-Medical): No  Physical Activity: Not on file  Stress: Not on file  Social Connections: Not on file  Intimate Partner Violence: Not At Risk (12/22/2021)   Humiliation, Afraid, Rape, and Kick questionnaire    Fear of Current or Ex-Partner: No    Emotionally Abused: No    Physically Abused: No    Sexually Abused: No    Review of Systems  All other systems reviewed and are negative.   PHYSICAL EXAMINATION:   BP 128/78 (BP Location: Left Arm, Patient Position: Sitting)   Pulse 64   LMP 05/22/2000 (Approximate) Comment: BTL  SpO2 99%     General appearance: alert, cooperative and appears stated age   Pelvic: External genitalia:  no lesions              Urethra:  normal appearing urethra with no masses, tenderness or lesions              Bartholins and Skenes: normal                 Vagina: right vaginal shallow ulceration.  Tx with AgNO3.               Cervix: no lesions                Bimanual Exam:  Uterus:  normal size, contour, position, consistency, mobility, non-tender              Adnexa: no mass, fullness, tenderness  ASSESSMENT:  Pessary maintenance.  Vaginal ulceration.   Vaginal atrophy.  Hx breast cancer.   PLAN:  Keep pessary out for one week.  Start vaginal vit E suppositories twice weekly.  Ok to place tomorrow.  Prescription updated  at [facility]. Follow up for breast and pelvic exam in October, 2025.  Call for recurrent bleeding.   20 min  total time was spent for this patient encounter, including preparation,  face-to-face counseling with the patient, coordination of care, and documentation of the encounter.

## 2023-11-20 ENCOUNTER — Ambulatory Visit: Admitting: Obstetrics and Gynecology

## 2024-01-15 ENCOUNTER — Other Ambulatory Visit: Payer: Self-pay | Admitting: Family Medicine

## 2024-01-15 DIAGNOSIS — Z1231 Encounter for screening mammogram for malignant neoplasm of breast: Secondary | ICD-10-CM

## 2024-02-01 ENCOUNTER — Encounter: Payer: Self-pay | Admitting: Internal Medicine

## 2024-02-04 ENCOUNTER — Ambulatory Visit
Admission: RE | Admit: 2024-02-04 | Discharge: 2024-02-04 | Disposition: A | Discharge status: Home / Self Care | Source: Ambulatory Visit | Attending: Family Medicine

## 2024-02-04 DIAGNOSIS — Z1231 Encounter for screening mammogram for malignant neoplasm of breast: Secondary | ICD-10-CM

## 2024-03-03 ENCOUNTER — Encounter: Payer: Self-pay | Admitting: Obstetrics and Gynecology

## 2024-03-03 ENCOUNTER — Ambulatory Visit (INDEPENDENT_AMBULATORY_CARE_PROVIDER_SITE_OTHER): Payer: Medicare PPO | Admitting: Obstetrics and Gynecology

## 2024-03-03 VITALS — BP 118/76 | HR 64 | Ht 65.25 in | Wt 132.0 lb

## 2024-03-03 DIAGNOSIS — Z853 Personal history of malignant neoplasm of breast: Secondary | ICD-10-CM

## 2024-03-03 DIAGNOSIS — N812 Incomplete uterovaginal prolapse: Secondary | ICD-10-CM

## 2024-03-03 DIAGNOSIS — B009 Herpesviral infection, unspecified: Secondary | ICD-10-CM

## 2024-03-03 DIAGNOSIS — Z01419 Encounter for gynecological examination (general) (routine) without abnormal findings: Secondary | ICD-10-CM

## 2024-03-03 DIAGNOSIS — N952 Postmenopausal atrophic vaginitis: Secondary | ICD-10-CM | POA: Diagnosis not present

## 2024-03-03 DIAGNOSIS — Z8781 Personal history of (healed) traumatic fracture: Secondary | ICD-10-CM

## 2024-03-03 DIAGNOSIS — Z8742 Personal history of other diseases of the female genital tract: Secondary | ICD-10-CM

## 2024-03-03 DIAGNOSIS — Z9289 Personal history of other medical treatment: Secondary | ICD-10-CM | POA: Diagnosis not present

## 2024-03-03 DIAGNOSIS — Z9189 Other specified personal risk factors, not elsewhere classified: Secondary | ICD-10-CM

## 2024-03-03 DIAGNOSIS — M8580 Other specified disorders of bone density and structure, unspecified site: Secondary | ICD-10-CM

## 2024-03-03 MED ORDER — VALACYCLOVIR HCL 1 G PO TABS: ORAL_TABLET | ORAL | 1 refills | Status: AC

## 2024-03-03 NOTE — Patient Instructions (Signed)
 Alendronate Solution What is this medication? ALENDRONATE (a LEN droe nate) treats osteoporosis. It works by Interior and spatial designer stronger and less likely to break (fracture). It belongs to a group of medications called bisphosphonates. This medicine may be used for other purposes; ask your health care provider or pharmacist if you have questions. COMMON BRAND NAME(S): Fosamax What should I tell my care team before I take this medication? They need to know if you have any of these conditions: Bleeding disorder Cancer Dental disease Difficulty swallowing Infection (fever, chills, cough, sore throat, pain or trouble passing urine) Kidney disease Low levels of calcium or other minerals in the blood Low red blood cell counts Receiving steroids like dexamethasone or prednisone Stomach or intestine problems Trouble sitting or standing for 30 minutes An unusual or allergic reaction to alendronate, other medications, foods, dyes or preservatives Pregnant or trying to get pregnant Breast-feeding How should I use this medication? Take this medication by mouth with a full glass of water. Take it as directed on the prescription label at the same time every day. Use a specially marked oral syringe, spoon, or dropper to measure each dose. Ask your pharmacist if you do not have one. Household spoons are not accurate. Take the dose right after waking up. Do not eat or drink anything before taking it. Do not take it with any other drink except water. After taking it, do not eat breakfast, drink, or take any other medications or vitamins for at least 30 minutes. Sit or stand up for at least 30 minutes after you take it. Do not lie down. Keep taking it unless your care team tells you to stop. A special MedGuide will be given to you by the pharmacist with each prescription and refill. Be sure to read this information carefully each time. Talk to your care team about the use of this medication in children. Special  care may be needed. Overdosage: If you think you have taken too much of this medicine contact a poison control center or emergency room at once. NOTE: This medicine is only for you. Do not share this medicine with others. What if I miss a dose? If you take your medication once a day, skip it. Take your next dose at the scheduled time the next morning. Do not take two doses on the same day. If you take your medication once a week, take the missed dose on the morning after you remember. Do not take two doses on the same day. What may interact with this medication? Aluminum hydroxide Antacids Aspirin Calcium supplements Iron supplements Magnesium supplements Medications for inflammation like ibuprofen , naproxen, and others Vitamins with minerals This list may not describe all possible interactions. Give your health care provider a list of all the medicines, herbs, non-prescription drugs, or dietary supplements you use. Also tell them if you smoke, drink alcohol, or use illegal drugs. Some items may interact with your medicine. What should I watch for while using this medication? Visit your care team for regular checks on your progress. It may be some time before you see the benefit from this medication. Some people who take this medication have severe bone, joint, or muscle pain. This medication may also increase your risk for jaw problems or a broken thigh bone. Tell your care team right away if you have severe pain in your jaw, bones, joints, or muscles. Tell you care team if you have any pain that does not go away or that gets worse. Tell your dentist  and dental surgeon that you are taking this medication. You should not have major dental surgery while on this medication. See your dentist to have a dental exam and fix any dental problems before starting this medication. Take good care of your teeth while on this medication. Make sure you see your dentist for regular follow-up appointments. You  should make sure you get enough calcium and vitamin D while you are taking this medication. Discuss the foods you eat and the vitamins you take with your care team. You may need blood work done while you are taking this medication. What side effects may I notice from receiving this medication? Side effects that you should report to your care team as soon as possible: Allergic reactions--skin rash, itching, hives, swelling of the face, lips, tongue, or throat Low calcium level--muscle pain or cramps, confusion, tingling, or numbness in the hands or feet Osteonecrosis of the jaw--pain, swelling, or redness in the mouth, numbness of the jaw, poor healing after dental work, unusual discharge from the mouth, visible bones in the mouth Pain or trouble swallowing Severe bone, joint, or muscle pain Stomach bleeding--bloody or black, tar-like stools, vomiting blood or brown material that looks like coffee grounds Side effects that usually do not require medical attention (report to your care team if they continue or are bothersome): Constipation Diarrhea Nausea Stomach pain This list may not describe all possible side effects. Call your doctor for medical advice about side effects. You may report side effects to FDA at 1-800-FDA-1088. Where should I keep my medication? Keep out of the reach of children and pets. Store at room temperature between 20 and 25 degrees C (68 and 77 degrees F). Do not freeze. Throw away any unused medication after the expiration date. NOTE: This sheet is a summary. It may not cover all possible information. If you have questions about this medicine, talk to your doctor, pharmacist, or health care provider.  2024 Elsevier/Gold Standard (2020-05-06 00:00:00)  Denosumab Injection (Osteoporosis) What is this medication? DENOSUMAB (den oh SUE mab) prevents and treats osteoporosis. It works by Interior and spatial designer stronger and less likely to break (fracture). It is a monoclonal  antibody. This medicine may be used for other purposes; ask your health care provider or pharmacist if you have questions. COMMON BRAND NAME(S): Prolia What should I tell my care team before I take this medication? They need to know if you have any of these conditions: Dental or gum disease Had thyroid or parathyroid (glands located in neck) surgery Having dental surgery or a tooth pulled Kidney disease Low levels of calcium in the blood On dialysis Poor nutrition Thyroid disease Trouble absorbing nutrients from your food An unusual or allergic reaction to denosumab, other medications, foods, dyes, or preservatives Pregnant or trying to get pregnant Breastfeeding How should I use this medication? This medication is injected under the skin. It is given by your care team in a hospital or clinic setting. A special MedGuide will be given to you before each treatment. Be sure to read this information carefully each time. Talk to your care team about the use of this medication in children. Special care may be needed. Overdosage: If you think you have taken too much of this medicine contact a poison control center or emergency room at once. NOTE: This medicine is only for you. Do not share this medicine with others. What if I miss a dose? Keep appointments for follow-up doses. It is important not to miss your dose. Call  your care team if you are unable to keep an appointment. What may interact with this medication? Do not take this medication with any of the following: Other medications that contain denosumab This medication may also interact with the following: Medications that lower your chance of fighting infection Steroid medications, such as prednisone or cortisone This list may not describe all possible interactions. Give your health care provider a list of all the medicines, herbs, non-prescription drugs, or dietary supplements you use. Also tell them if you smoke, drink alcohol, or  use illegal drugs. Some items may interact with your medicine. What should I watch for while using this medication? Your condition will be monitored carefully while you are receiving this medication. You may need blood work done while taking this medication. This medication may increase your risk of getting an infection. Call your care team for advice if you get a fever, chills, sore throat, or other symptoms of a cold or flu. Do not treat yourself. Try to avoid being around people who are sick. Tell your dentist and dental surgeon that you are taking this medication. You should not have major dental surgery while on this medication. See your dentist to have a dental exam and fix any dental problems before starting this medication. Take good care of your teeth while on this medication. Make sure you see your dentist for regular follow-up appointments. This medication may cause low levels of calcium in your body. The risk of severe side effects is increased in people with kidney disease. Your care team may prescribe calcium and vitamin D to help prevent low calcium levels while you take this medication. It is important to take calcium and vitamin D as directed by your care team. Talk to your care team if you may be pregnant. Serious birth defects may occur if you take this medication during pregnancy and for 5 months after the last dose. You will need a negative pregnancy test before starting this medication. Contraception is recommended while taking this medication and for 5 months after the last dose. Your care team can help you find the option that works for you. Talk to your care team before breastfeeding. Changes to your treatment plan may be needed. What side effects may I notice from receiving this medication? Side effects that you should report to your care team as soon as possible: Allergic reactions--skin rash, itching, hives, swelling of the face, lips, tongue, or throat Infection--fever,  chills, cough, sore throat, wounds that don't heal, pain or trouble when passing urine, general feeling of discomfort or being unwell Low calcium level--muscle pain or cramps, confusion, tingling, or numbness in the hands or feet Osteonecrosis of the jaw--pain, swelling, or redness in the mouth, numbness of the jaw, poor healing after dental work, unusual discharge from the mouth, visible bones in the mouth Severe bone, joint, or muscle pain Skin infection--skin redness, swelling, warmth, or pain Side effects that usually do not require medical attention (report these to your care team if they continue or are bothersome): Back pain Headache Joint pain Muscle pain Pain in the hands, arms, legs, or feet Runny or stuffy nose Sore throat This list may not describe all possible side effects. Call your doctor for medical advice about side effects. You may report side effects to FDA at 1-800-FDA-1088. Where should I keep my medication? This medication is given in a hospital or clinic. It will not be stored at home. NOTE: This sheet is a summary. It may not cover all  possible information. If you have questions about this medicine, talk to your doctor, pharmacist, or health care provider.  2024 Elsevier/Gold Standard (2022-06-13 00:00:00)  Zoledronic Acid Injection (Bone Disorders) What is this medication? ZOLEDRONIC ACID (ZOE le dron ik AS id) prevents and treats osteoporosis. It may also be used to treat Paget's disease of the bone. It works by Interior and spatial designer stronger and less likely to break (fracture). It belongs to a group of medications called bisphosphonates. This medicine may be used for other purposes; ask your health care provider or pharmacist if you have questions. COMMON BRAND NAME(S): Reclast What should I tell my care team before I take this medication? They need to know if you have any of these conditions: Bleeding disorder Cancer Dental disease Kidney disease Low levels of  calcium in the blood Low red blood cell counts Lung or breathing disease, such as asthma Receiving steroids, such as dexamethasone or prednisone An unusual or allergic reaction to zoledronic acid, other medications, foods, dyes, or preservatives Pregnant or trying to get pregnant Breast-feeding How should I use this medication? This medication is injected into a vein. It is given by your care team in a hospital or clinic setting. A special MedGuide will be given to you before each treatment. Be sure to read this information carefully each time. Talk to your care team about the use of this medication in children. Special care may be needed. Overdosage: If you think you have taken too much of this medicine contact a poison control center or emergency room at once. NOTE: This medicine is only for you. Do not share this medicine with others. What if I miss a dose? Keep appointments for follow-up doses. It is important not to miss your dose. Call your care team if you are unable to keep an appointment. What may interact with this medication? Certain antibiotics given by injection Medications for pain and inflammation, such as ibuprofen , naproxen, NSAIDs Some diuretics, such as bumetanide, furosemide Teriparatide This list may not describe all possible interactions. Give your health care provider a list of all the medicines, herbs, non-prescription drugs, or dietary supplements you use. Also tell them if you smoke, drink alcohol, or use illegal drugs. Some items may interact with your medicine. What should I watch for while using this medication? Visit your care team for regular checks on your progress. It may be some time before you see the benefit from this medication. Some people who take this medication have severe bone, joint, or muscle pain. This medication may also increase your risk for jaw problems or a broken thigh bone. Tell your care team right away if you have severe pain in your jaw,  bones, joints, or muscles. Tell your care team if you have any pain that does not go away or that gets worse. You should make sure you get enough calcium and vitamin D while you are taking this medication. Discuss the foods you eat and the vitamins you take with your care team. You may need bloodwork while taking this medication. Tell your dentist and dental surgeon that you are taking this medication. You should not have major dental surgery while on this medication. See your dentist to have a dental exam and fix any dental problems before starting this medication. Take good care of your teeth while on this medication. Make sure you see your dentist for regular follow-up appointments. What side effects may I notice from receiving this medication? Side effects that you should report to your  care team as soon as possible: Allergic reactions--skin rash, itching, hives, swelling of the face, lips, tongue, or throat Kidney injury--decrease in the amount of urine, swelling of the ankles, hands, or feet Low calcium level--muscle pain or cramps, confusion, tingling, or numbness in the hands or feet Osteonecrosis of the jaw--pain, swelling, or redness in the mouth, numbness of the jaw, poor healing after dental work, unusual discharge from the mouth, visible bones in the mouth Severe bone, joint, or muscle pain Side effects that usually do not require medical attention (report to your care team if they continue or are bothersome): Diarrhea Dizziness Headache Nausea Stomach pain Vomiting This list may not describe all possible side effects. Call your doctor for medical advice about side effects. You may report side effects to FDA at 1-800-FDA-1088. Where should I keep my medication? This medication is given in a hospital or clinic. It will not be stored at home. NOTE: This sheet is a summary. It may not cover all possible information. If you have questions about this medicine, talk to your doctor,  pharmacist, or health care provider.  2024 Elsevier/Gold Standard (2021-06-24 00:00:00)  EXERCISE AND DIET:  We recommended that you start or continue a regular exercise program for good health. Regular exercise means any activity that makes your heart beat faster and makes you sweat.  We recommend exercising at least 30 minutes per day at least 3 days a week, preferably 4 or 5.  We also recommend a diet low in fat and sugar.  Inactivity, poor dietary choices and obesity can cause diabetes, heart attack, stroke, and kidney damage, among others.    ALCOHOL AND SMOKING:  Women should limit their alcohol intake to no more than 7 drinks/beers/glasses of wine (combined, not each!) per week. Moderation of alcohol intake to this level decreases your risk of breast cancer and liver damage. And of course, no recreational drugs are part of a healthy lifestyle.  And absolutely no smoking or even second hand smoke. Most people know smoking can cause heart and lung diseases, but did you know it also contributes to weakening of your bones? Aging of your skin?  Yellowing of your teeth and nails?  CALCIUM AND VITAMIN D:  Adequate intake of calcium and Vitamin D are recommended.  The recommendations for exact amounts of these supplements seem to change often, but generally speaking 600 mg of calcium (either carbonate or citrate) and 800 units of Vitamin D per day seems prudent. Certain women may benefit from higher intake of Vitamin D.  If you are among these women, your doctor will have told you during your visit.    PAP SMEARS:  Pap smears, to check for cervical cancer or precancers,  have traditionally been done yearly, although recent scientific advances have shown that most women can have pap smears less often.  However, every woman still should have a physical exam from her gynecologist every year. It will include a breast check, inspection of the vulva and vagina to check for abnormal growths or skin changes, a  visual exam of the cervix, and then an exam to evaluate the size and shape of the uterus and ovaries.  And after 77 years of age, a rectal exam is indicated to check for rectal cancers. We will also provide age appropriate advice regarding health maintenance, like when you should have certain vaccines, screening for sexually transmitted diseases, bone density testing, colonoscopy, mammograms, etc.   MAMMOGRAMS:  All women over 33 years old should have  a yearly mammogram. Many facilities now offer a 3D mammogram, which may cost around $50 extra out of pocket. If possible,  we recommend you accept the option to have the 3D mammogram performed.  It both reduces the number of women who will be called back for extra views which then turn out to be normal, and it is better than the routine mammogram at detecting truly abnormal areas.    COLONOSCOPY:  Colonoscopy to screen for colon cancer is recommended for all women at age 94.  We know, you hate the idea of the prep.  We agree, BUT, having colon cancer and not knowing it is worse!!  Colon cancer so often starts as a polyp that can be seen and removed at colonscopy, which can quite literally save your life!  And if your first colonoscopy is normal and you have no family history of colon cancer, most women don't have to have it again for 10 years.  Once every ten years, you can do something that may end up saving your life, right?  We will be happy to help you get it scheduled when you are ready.  Be sure to check your insurance coverage so you understand how much it will cost.  It may be covered as a preventative service at no cost, but you should check your particular policy.

## 2024-03-03 NOTE — Progress Notes (Unsigned)
 78 y.o. [id number] Married Caucasian female here for a breast and pelvic exam.    The patient is also followed for pessary maintenance, vaginal atrophy. Stopped using the pessary in July due to bleeding.   Has not been using the vit E suppositories.   The prolapse affects her activity level.   Takes long to void and leaks more when the pessary is out.  Shifts position sometimes to have BMs.   Has done pelvic floor PT.   PCP: Chrystal Lamarr RAMAN, MD   Patient's last menstrual period was 05/22/2000 (approximate).           Sexually active: No.  The current method of family planning is post menopausal status.    Menopausal hormone therapy:  n/a Exercising: Yes.    Walking and yoga  Smoker:  no  OB History     Gravida  2   Para  2   Term  2   Preterm      AB      Living  2      SAB      IAB      Ectopic      Multiple      Live Births              HEALTH MAINTENANCE: Last 2 paps: 05/01/23 ASCUS, HR HPV neg, 02/21/21 neg History of abnormal Pap or positive HPV:  yes Mammogram:  02/04/24 Breast Density Cat B, BIRADS Cat 1 neg  Colonoscopy:  01/08/18 - polyp - follow up 5 years -  Bone Density:  05/31/23  Result  osteopenic    Immunization History  Administered Date(s) Administered  . Influenza,inj,Quad PF,6+ Mos 02/21/2018  . PFIZER(Purple Top)SARS-COV-2 Vaccination 06/28/2019, 07/23/2019  . Tdap 12/27/2012, 05/31/2021      reports that she has never smoked. She has never used smokeless tobacco. She reports that she does not drink alcohol and does not use drugs.  Past Medical History:  Diagnosis Date  . Allergy   . Breast cancer (HCC)    right DCIS at age 3  . Cystocele with prolapse   . History of fractured kneecap    2022  . HSV-1 infection   . Hypertension   . Menopause 1998  . Osteopenia 2017   FRAX - increased risk of fracture    Past Surgical History:  Procedure Laterality Date  . AUGMENTATION MAMMAPLASTY Right 09/2008   right  breast reconstruction  . BREAST BIOPSY Left 2017  . MASTECTOMY Right 05/2008   DCIS Multicentric--Mastectomy  (neg) nodes---no tamox  . MOHS SURGERY  05/22/2020   on nose for basal cell  . TUBAL LIGATION     BTSP    Current Outpatient Medications  Medication Sig Dispense Refill  . cetirizine (ZYRTEC) 10 MG tablet Take by mouth.    . docusate sodium (COLACE) 100 MG capsule Take 100 mg by mouth 2 (two) times daily.    . fluticasone (FLONASE) 50 MCG/ACT nasal spray Place into both nostrils as needed for allergies or rhinitis.    . hydrochlorothiazide (MICROZIDE) 12.5 MG capsule daily.     . Multiple Vitamin (MULTIVITAMIN) tablet Take 1 tablet by mouth daily.    SABRA NIACINAMIDE PO Take by mouth.    . NONFORMULARY OR COMPOUNDED ITEM Vaginal vitamin E suppository 200 international units, place one per vagina at bedtime twice a week.  Dispense:  8.  Refill:  5.  [facility]. 8 each 5  . ramipril (ALTACE) 10 MG  tablet Take 10 mg by mouth daily.    . valACYclovir  (VALTREX ) 1000 MG tablet Take 2 tablets (2000 mg) by mouth twice daily for 24 hours as needed for cold sores. 30 tablet 1  . VITAMIN D PO Take 2,000 Int'l Units by mouth. Vitamin d3     No current facility-administered medications for this visit.    ALLERGIES: Tetracyclines & related  Family History  Problem Relation Age of Onset  . Osteoporosis Mother   . Heart disease Father        died of heart attack age 42  . Osteoporosis Maternal Aunt   . Breast cancer Maternal Grandmother 80  . Breast cancer Cousin 40       maternal 1st cousin    Review of Systems  All other systems reviewed and are negative.   PHYSICAL EXAM:  BP 118/76 (BP Location: Left Arm, Patient Position: Sitting)   Pulse 64   Ht 5' 5.25 (1.657 m)   Wt 132 lb (59.9 kg)   LMP 05/22/2000 (Approximate) Comment: BTL  SpO2 98%   BMI 21.80 kg/m     General appearance: alert, cooperative and appears stated age Head: normocephalic, without obvious  abnormality, atraumatic Neck: no adenopathy, supple, symmetrical, trachea midline and thyroid normal to inspection and palpation Lungs: clear to auscultation bilaterally Breasts: normal appearance, no masses or tenderness, No nipple retraction or dimpling, No nipple discharge or bleeding, No axillary adenopathy Heart: regular rate and rhythm Abdomen: soft, non-tender; no masses, no organomegaly Extremities: extremities normal, atraumatic, no cyanosis or edema Skin: skin color, texture, turgor normal. No rashes or lesions Lymph nodes: cervical, supraclavicular, and axillary nodes normal. Neurologic: grossly normal  Pelvic: External genitalia:  no lesions              No abnormal inguinal nodes palpated.              Urethra:  normal appearing urethra with no masses, tenderness or lesions              Bartholins and Skenes: normal                 Vagina: normal appearing vagina with normal color and discharge, no lesions              Cervix: no lesions              Pap taken: {yes no:314532} Bimanual Exam:  Uterus:  normal size, contour, position, consistency, mobility, non-tender              Adnexa: no mass, fullness, tenderness              Rectal exam: {yes no:314532}.  Confirms.              Anus:  normal sphincter tone, no lesions  Chaperone was present for exam:  {BSCHAPERONE:31226::Emily F, CMA}  ASSESSMENT: Encounter for breast and pelvic exam.  Personal history of other medical treatment.  Personal risk factors not specified elsewhere.  Incomplete uterovaginal prolapse. Pessary maintenance.  Vaginal ulceration.   Vaginal atrophy.  Oral HSV.  Hx breast cancer.  BRCA negative.  Osteopenia.  Hx left patellar fracture.   ***  PLAN: Mammogram screening discussed. Self breast awareness reviewed. Pap and HRV collected:  {yes no:314532} Guidelines for Calcium, Vitamin D, regular exercise program including cardiovascular and weight bearing exercise. Medication refills:   *** {LABS (Optional):23779} Follow up:  ***    Additional counseling given.  {yes [id number]. ***  total  time was spent for this patient encounter, including preparation, face-to-face counseling with the patient, coordination of care, and documentation of the encounter in addition to doing the breast and pelvic exam.

## 2024-03-04 ENCOUNTER — Telehealth: Payer: Self-pay | Admitting: Obstetrics and Gynecology

## 2024-03-04 DIAGNOSIS — N812 Incomplete uterovaginal prolapse: Secondary | ICD-10-CM

## 2024-03-04 NOTE — Telephone Encounter (Signed)
 Please make referral to Dr. Marilynne, [facility], for incomplete uterovaginal prolapse and surgical consideration.

## 2024-03-05 NOTE — Telephone Encounter (Signed)
Referral placed.   Routing to [employer].   Encounter closed.

## 2024-03-17 DIAGNOSIS — Z08 Encounter for follow-up examination after completed treatment for malignant neoplasm: Secondary | ICD-10-CM | POA: Diagnosis not present

## 2024-03-17 DIAGNOSIS — L814 Other melanin hyperpigmentation: Secondary | ICD-10-CM | POA: Diagnosis not present

## 2024-03-17 DIAGNOSIS — D225 Melanocytic nevi of trunk: Secondary | ICD-10-CM | POA: Diagnosis not present

## 2024-03-17 DIAGNOSIS — Z85828 Personal history of other malignant neoplasm of skin: Secondary | ICD-10-CM | POA: Diagnosis not present

## 2024-03-17 DIAGNOSIS — C44519 Basal cell carcinoma of skin of other part of trunk: Secondary | ICD-10-CM | POA: Diagnosis not present

## 2024-03-17 DIAGNOSIS — L570 Actinic keratosis: Secondary | ICD-10-CM | POA: Diagnosis not present

## 2024-03-17 DIAGNOSIS — L821 Other seborrheic keratosis: Secondary | ICD-10-CM | POA: Diagnosis not present

## 2024-03-20 ENCOUNTER — Telehealth: Payer: Self-pay | Admitting: Internal Medicine

## 2024-03-21 ENCOUNTER — Ambulatory Visit: Admitting: Internal Medicine

## 2024-03-24 DIAGNOSIS — Z23 Encounter for immunization: Secondary | ICD-10-CM | POA: Diagnosis not present

## 2024-03-24 DIAGNOSIS — Z0000 Encounter for general adult medical examination without abnormal findings: Secondary | ICD-10-CM | POA: Diagnosis not present

## 2024-03-24 DIAGNOSIS — Z79899 Other long term (current) drug therapy: Secondary | ICD-10-CM | POA: Diagnosis not present

## 2024-03-24 DIAGNOSIS — I10 Essential (primary) hypertension: Secondary | ICD-10-CM | POA: Diagnosis not present

## 2024-03-24 DIAGNOSIS — E559 Vitamin D deficiency, unspecified: Secondary | ICD-10-CM | POA: Diagnosis not present

## 2024-03-24 DIAGNOSIS — M8589 Other specified disorders of bone density and structure, multiple sites: Secondary | ICD-10-CM | POA: Diagnosis not present

## 2024-03-24 DIAGNOSIS — E7800 Pure hypercholesterolemia, unspecified: Secondary | ICD-10-CM | POA: Diagnosis not present

## 2024-03-24 DIAGNOSIS — Z860100 Personal history of colon polyps, unspecified: Secondary | ICD-10-CM | POA: Diagnosis not present

## 2024-03-24 LAB — LAB REPORT - SCANNED: EGFR: 86

## 2024-04-08 ENCOUNTER — Other Ambulatory Visit: Payer: Self-pay | Admitting: Obstetrics and Gynecology

## 2024-04-08 ENCOUNTER — Encounter: Payer: Self-pay | Admitting: Obstetrics and Gynecology

## 2024-04-08 DIAGNOSIS — Z9189 Other specified personal risk factors, not elsewhere classified: Secondary | ICD-10-CM

## 2024-04-08 DIAGNOSIS — M8589 Other specified disorders of bone density and structure, multiple sites: Secondary | ICD-10-CM

## 2024-06-10 ENCOUNTER — Ambulatory Visit: Admitting: Internal Medicine

## 2024-06-10 ENCOUNTER — Encounter: Payer: Self-pay | Admitting: Internal Medicine

## 2024-06-10 VITALS — BP 130/66 | HR 75 | Ht 62.25 in | Wt 137.2 lb

## 2024-06-10 DIAGNOSIS — Z860101 Personal history of adenomatous and serrated colon polyps: Secondary | ICD-10-CM

## 2024-06-10 DIAGNOSIS — Z09 Encounter for follow-up examination after completed treatment for conditions other than malignant neoplasm: Secondary | ICD-10-CM

## 2024-06-10 DIAGNOSIS — Z860100 Personal history of colon polyps, unspecified: Secondary | ICD-10-CM

## 2024-06-10 NOTE — Progress Notes (Signed)
 HISTORY OF PRESENT ILLNESS:  Kendra Velazquez is a 79 y.o. female with past medical history as listed below.  She is sent here by her PCP regarding the need for surveillance colonoscopy.  The patient has had colonoscopy in 2004, 2009, and 2019.  All examinations were negative for neoplasia except 2019 when she was found to have a 1 mm adenomatous polyp that was removed.  Diverticulosis and internal hemorrhoids.  No other abnormalities.  At the time of her recall, her chart was reviewed and in keeping with the current guidelines, it was felt that she did not need a follow-up surveillance colonoscopy.  Her PCP was not certain about this recommendation.  Patient's GI review of systems is entirely negative.  No anemia.  No family history of colon cancer.  REVIEW OF SYSTEMS:  All non-GI ROS negative. Past Medical History:  Diagnosis Date   Allergy    Breast cancer (HCC)    right DCIS at age 76   Cystocele with prolapse    History of fractured kneecap    2022   HSV-1 infection    Hypertension    Menopause 1998   Osteopenia 2017   FRAX - increased risk of fracture    Past Surgical History:  Procedure Laterality Date   AUGMENTATION MAMMAPLASTY Right 09/2008   right breast reconstruction   BREAST BIOPSY Left 2017   MASTECTOMY Right 05/2008   DCIS Multicentric--Mastectomy  (neg) nodes---no tamox   MOHS SURGERY  05/22/2020   on nose for basal cell   TUBAL LIGATION     BTSP    Social History Kendra Velazquez  reports that she has never smoked. She has never used smokeless tobacco. She reports that she does not drink alcohol and does not use drugs.  family history includes Breast cancer (age of onset: 32) in her cousin; Breast cancer (age of onset: 39) in her maternal grandmother; Heart disease in her father; Osteoporosis in her maternal aunt and mother.  Allergies[1]     PHYSICAL EXAMINATION: Vital signs: BP 130/66   Pulse 75   Ht 5' 2.25 (1.581 m)   Wt 137 lb 4  oz (62.3 kg)   LMP 05/22/2000 Comment: BTL  BMI 24.90 kg/m   Constitutional: generally well-appearing, no acute distress Psychiatric: alert and oriented x3, cooperative Eyes: extraocular movements intact, anicteric, conjunctiva pink Mouth: oral pharynx moist, no lesions Neck: supple no lymphadenopathy Cardiovascular: heart regular rate and rhythm, soft systolic murmur Lungs: clear to auscultation bilaterally Abdomen: soft, nontender, nondistended, no obvious ascites, no peritoneal signs, normal bowel sounds, no organomegaly Rectal: Omitted Extremities: no Levin, cyanosis, or lower extremity edema bilaterally Skin: no lesions on visible extremities Neuro: No focal deficits.  Cranial nerves intact  ASSESSMENT:  1.  3 prior colonoscopies as described.  Most recent 2019 with diminutive adenoma.  Other examinations were negative for neoplasia.  Currently without symptoms or signs that would warrant colonoscopy.  I reviewed with her the most current guidelines regarding surveillance colonoscopy.  Her follow-up recommendation would be 10 years from the last, this would put her at 43.   PLAN:  1.  No plans for routine surveillance colonoscopy as outlined above.  She is comfortable with this recommendation, and knows that she could reach out to us  at any time for questions or problems.  Otherwise, she will return to the care of her PCP         [1]  Allergies Allergen Reactions   Tetracyclines & Related Hives

## 2024-06-10 NOTE — Patient Instructions (Signed)
 Please follow up as needed.  _______________________________________________________  If your blood pressure at your visit was 140/90 or greater, please contact your primary care physician to follow up on this.  _______________________________________________________  If you are age 78 or older, your body mass index should be between 23-30. Your Body mass index is 24.9 kg/m. If this is out of the aforementioned range listed, please consider follow up with your Primary Care Provider.  If you are age 35 or younger, your body mass index should be between 19-25. Your Body mass index is 24.9 kg/m. If this is out of the aformentioned range listed, please consider follow up with your Primary Care Provider.   ________________________________________________________  The [facility] providers would like to encourage you to use MYCHART to communicate with providers for non-urgent requests or questions.  Due to long hold times on the telephone, sending your provider a message by [facility] may be a faster and more efficient way to get a response.  Please allow 48 business hours for a response.  Please remember that this is for non-urgent requests.  _______________________________________________________  [facility] is using a team-based approach to care.  Your team is made up of your doctor and two to three APPS. Our APPS (Nurse Practitioners and Physician Assistants) work with your physician to ensure care continuity for you. They are fully qualified to address your health concerns and develop a treatment plan. They communicate directly with your gastroenterologist to care for you. Seeing the Advanced Practice Practitioners on your physician's team can help you by facilitating care more promptly, often allowing for earlier appointments, access to diagnostic testing, procedures, and other specialty referrals.

## 2025-03-05 ENCOUNTER — Encounter: Admitting: Obstetrics and Gynecology
